# Patient Record
Sex: Female | Born: 1965 | Race: Black or African American | Hispanic: No | Marital: Single | Smoking: Current every day smoker
Health system: Southern US, Community
[De-identification: ages and names within clinical notes are randomized; demographics above are authoritative.]

## PROBLEM LIST (undated history)

## (undated) DIAGNOSIS — I10 Essential (primary) hypertension: Secondary | ICD-10-CM

## (undated) DIAGNOSIS — Z8719 Personal history of other diseases of the digestive system: Secondary | ICD-10-CM

## (undated) DIAGNOSIS — N200 Calculus of kidney: Secondary | ICD-10-CM

## (undated) HISTORY — PX: ABDOMINAL HYSTERECTOMY: SHX81

## (undated) HISTORY — PX: COLON SURGERY: SHX602

## (undated) HISTORY — PX: FRACTURE SURGERY: SHX138

---

## 2001-08-27 ENCOUNTER — Emergency Department (HOSPITAL_COMMUNITY): Admission: EM | Admit: 2001-08-27 | Discharge: 2001-08-27 | Payer: Self-pay | Admitting: *Deleted

## 2002-11-23 DIAGNOSIS — Z8719 Personal history of other diseases of the digestive system: Secondary | ICD-10-CM

## 2002-11-23 HISTORY — DX: Personal history of other diseases of the digestive system: Z87.19

## 2003-06-14 ENCOUNTER — Encounter: Payer: Self-pay | Admitting: Emergency Medicine

## 2003-06-14 ENCOUNTER — Emergency Department (HOSPITAL_COMMUNITY): Admission: EM | Admit: 2003-06-14 | Discharge: 2003-06-14 | Payer: Self-pay | Admitting: Emergency Medicine

## 2003-06-21 ENCOUNTER — Inpatient Hospital Stay (HOSPITAL_COMMUNITY): Admission: RE | Admit: 2003-06-21 | Discharge: 2003-06-23 | Payer: Self-pay | Admitting: Obstetrics & Gynecology

## 2003-10-18 ENCOUNTER — Emergency Department (HOSPITAL_COMMUNITY): Admission: EM | Admit: 2003-10-18 | Discharge: 2003-10-18 | Payer: Self-pay | Admitting: Emergency Medicine

## 2003-11-08 ENCOUNTER — Inpatient Hospital Stay (HOSPITAL_COMMUNITY): Admission: RE | Admit: 2003-11-08 | Discharge: 2003-11-16 | Payer: Self-pay | Admitting: Obstetrics & Gynecology

## 2003-11-19 ENCOUNTER — Inpatient Hospital Stay (HOSPITAL_COMMUNITY): Admission: EM | Admit: 2003-11-19 | Discharge: 2003-12-07 | Payer: Self-pay | Admitting: Emergency Medicine

## 2005-03-09 ENCOUNTER — Emergency Department (HOSPITAL_COMMUNITY): Admission: EM | Admit: 2005-03-09 | Discharge: 2005-03-09 | Payer: Self-pay | Admitting: Emergency Medicine

## 2005-11-24 ENCOUNTER — Emergency Department (HOSPITAL_COMMUNITY): Admission: EM | Admit: 2005-11-24 | Discharge: 2005-11-24 | Payer: Self-pay | Admitting: Emergency Medicine

## 2007-01-06 ENCOUNTER — Emergency Department (HOSPITAL_COMMUNITY): Admission: EM | Admit: 2007-01-06 | Discharge: 2007-01-06 | Payer: Self-pay | Admitting: Emergency Medicine

## 2007-05-23 ENCOUNTER — Emergency Department (HOSPITAL_COMMUNITY): Admission: EM | Admit: 2007-05-23 | Discharge: 2007-05-23 | Payer: Self-pay | Admitting: Emergency Medicine

## 2009-10-25 ENCOUNTER — Emergency Department (HOSPITAL_COMMUNITY): Admission: EM | Admit: 2009-10-25 | Discharge: 2009-10-25 | Payer: Self-pay | Admitting: Emergency Medicine

## 2010-09-02 ENCOUNTER — Emergency Department (HOSPITAL_COMMUNITY)
Admission: EM | Admit: 2010-09-02 | Discharge: 2010-09-02 | Payer: Self-pay | Source: Home / Self Care | Admitting: Emergency Medicine

## 2010-10-11 ENCOUNTER — Inpatient Hospital Stay (HOSPITAL_COMMUNITY): Admission: EM | Admit: 2010-10-11 | Discharge: 2010-10-13 | Payer: Self-pay | Admitting: Emergency Medicine

## 2011-02-03 LAB — URINALYSIS, ROUTINE W REFLEX MICROSCOPIC
Bilirubin Urine: NEGATIVE
Glucose, UA: NEGATIVE mg/dL
Hgb urine dipstick: NEGATIVE
Ketones, ur: NEGATIVE mg/dL
Nitrite: NEGATIVE
Protein, ur: NEGATIVE mg/dL
Specific Gravity, Urine: 1.025 (ref 1.005–1.030)
Urobilinogen, UA: 0.2 mg/dL (ref 0.0–1.0)
pH: 5.5 (ref 5.0–8.0)

## 2011-02-03 LAB — BASIC METABOLIC PANEL
BUN: 13 mg/dL (ref 6–23)
BUN: 5 mg/dL — ABNORMAL LOW (ref 6–23)
CO2: 24 mEq/L (ref 19–32)
Calcium: 8 mg/dL — ABNORMAL LOW (ref 8.4–10.5)
Chloride: 108 mEq/L (ref 96–112)
Chloride: 113 mEq/L — ABNORMAL HIGH (ref 96–112)
Creatinine, Ser: 0.42 mg/dL (ref 0.4–1.2)
Creatinine, Ser: 0.51 mg/dL (ref 0.4–1.2)
GFR calc Af Amer: >60 mL/min (ref >60)
GFR calc non Af Amer: >60 mL/min (ref >60)
Glucose, Bld: 107 mg/dL — ABNORMAL HIGH (ref 70–99)
Glucose, Bld: 97 mg/dL (ref 70–99)
Potassium: 4.2 mEq/L (ref 3.5–5.1)
Sodium: 140 mEq/L (ref 135–145)

## 2011-02-03 LAB — CBC
HCT: 34.3 % — ABNORMAL LOW (ref 36.0–46.0)
Hemoglobin: 11.1 g/dL — ABNORMAL LOW (ref 12.0–15.0)
MCH: 26.8 pg (ref 26.0–34.0)
MCHC: 31.9 g/dL (ref 30.0–36.0)
MCHC: 32.4 g/dL (ref 30.0–36.0)
MCV: 82.9 fL (ref 78.0–100.0)
MCV: 83 fL (ref 78.0–100.0)
Platelets: 272 10*3/uL (ref 150–400)
Platelets: 286 10*3/uL (ref 150–400)
RBC: 4.14 MIL/uL (ref 3.87–5.11)
RDW: 15.5 % (ref 11.5–15.5)
RDW: 15.7 % — ABNORMAL HIGH (ref 11.5–15.5)
WBC: 6.7 10*3/uL (ref 4.0–10.5)
WBC: 8.9 10*3/uL (ref 4.0–10.5)

## 2011-02-03 LAB — PROTIME-INR
INR: 1.01 (ref 0.00–1.49)
Prothrombin Time: 13.5 seconds (ref 11.6–15.2)

## 2011-02-03 LAB — APTT: aPTT: 30 seconds (ref 24–37)

## 2011-02-03 LAB — DIFFERENTIAL
Basophils Absolute: 0 10*3/uL (ref 0.0–0.1)
Basophils Relative: 0 % (ref 0–1)
Eosinophils Absolute: 0 10*3/uL (ref 0.0–0.7)
Eosinophils Relative: 0 % (ref 0–5)
Lymphocytes Relative: 27 % (ref 12–46)
Lymphs Abs: 1.8 10*3/uL (ref 0.7–4.0)
Monocytes Absolute: 0.4 10*3/uL (ref 0.1–1.0)
Monocytes Relative: 6 % (ref 3–12)
Neutro Abs: 4.5 10*3/uL (ref 1.7–7.7)
Neutrophils Relative %: 67 % (ref 43–77)

## 2011-02-03 LAB — URINE CULTURE
Colony Count: NO GROWTH
Culture  Setup Time: 201111191943
Culture: NO GROWTH

## 2011-02-04 LAB — URINALYSIS, ROUTINE W REFLEX MICROSCOPIC
Glucose, UA: NEGATIVE mg/dL
Hgb urine dipstick: NEGATIVE
Protein, ur: NEGATIVE mg/dL
Specific Gravity, Urine: 1.005 (ref 1.005–1.030)
pH: 5.5 (ref 5.0–8.0)

## 2011-02-04 LAB — URINE MICROSCOPIC-ADD ON

## 2011-04-10 NOTE — Op Note (Signed)
NAMEMATY, ZEISLER                         ACCOUNT NO.:  [id number]   MEDICAL RECORD NO.:  [id number]                   PATIENT TYPE:  INP   LOCATION:  A418                                 FACILITY:  [facility]   PHYSICIAN:  Lazaro Arms, M.D.                DATE OF BIRTH:  1966-05-02   DATE OF PROCEDURE:  11/08/2003  DATE OF DISCHARGE:                                 OPERATIVE REPORT   PREOPERATIVE DIAGNOSES:  1. Large right pelvic mass.  2. Right lower quadrant pain.   POSTOPERATIVE DIAGNOSES:  1. Large right pelvic mass.  2. Right lower quadrant pain.  3. Large right ovarian cyst.   OPERATION PERFORMED:  1. Laparotomy with right salpingo-oophorectomy.  2. Ruptured cyst intraoperatively.   SURGEON:  Lazaro Arms, M.D.   ANESTHESIA:  General endotracheal.   OPERATIVE FINDINGS:  The patient had a large ovarian cyst of the right  ovary.  It was normal when we did her surgery back in July.  It basically  filled the entire pelvis and was densely adherent to the pelvic sidewall and  small bowel and the sigmoid colon.  Tried to dissect it off the small bowel.  It was ruptured and a large amount of fluid came out and it was straw  colored.  It was not concerning for neoplasm.   DESCRIPTION OF PROCEDURE:  The patient was taken to the operating room and  placed in supine position where she underwent general endotracheal.  Foley  catheter was placed.  Her abdomen was prepped and draped in the usual  sterile fashion.  A Pfannenstiel skin incision was made and the peritoneal  cavity was entered in the usual fashion.  The mass was identified.  It was  found to be wrapped up in small bowel anteriorly, the pelvic sidewall  laterally and the sigmoid colon.  Blunt and sharp dissection was performed  to liberate it from the pelvis.  The infundibulopelvic ligament was  isolated, clamped, cut and transection suture ligated doubly with good  hemostasis.  I then ruptured the cyst trying to  dissect it off the small  bowel.  There was some serosal denuding of the small bowel and this was  oversewn without difficulty.  The small bowel was not entered during the  case.  The pelvic side wall was sort of oozy.  We held pressure and placed  some Surgicel down there.  Specimen was sent to pathology for  evaluation.  The muscle was reapproximated loosely.  The fascia closed in 0  Vicryl running, subcutaneous tissues made hemostatic and irrigated.  Skin  was closed using skin staples.  The patient tolerated the procedure well.  She experienced 100 mL of blood loss and was taken to recovery room in good  and stable condition.  All counts correct.      ___________________________________________  Lazaro Arms, M.D.   Loraine Maple  D:  11/08/2003  T:  11/09/2003  Job:  [id number]

## 2011-04-10 NOTE — Consult Note (Signed)
Ashley Rojas, RACEY                         ACCOUNT NO.:  [id number]   MEDICAL RECORD NO.:  [id number]                   PATIENT TYPE:  INP   LOCATION:  A417                                 FACILITY:  [facility]   PHYSICIAN:  Jerolyn Shin C. Katrinka Blazing, M.D.                DATE OF BIRTH:  Nov 06, 1966   DATE OF CONSULTATION:  DATE OF DISCHARGE:                                   CONSULTATION   A 45 year old female readmitted for recurrent abdominal swelling with  episodic abdominal pain, nausea, and vomiting. The patient has a history of  having total abdominal hysterectomy with left salpingo-oophorectomy on June 21, 2003.  She was discharged uneventfully and did well until she re-  presented with evidence of recurrent abdominal pain and a complex pelvic  mass.  She was explored on November 08, 2003, and was found to have a large  mass of the pelvis that was densely adherent to the small bowel and sigmoid  colon. Resection of the viscera had to be carried out in order for the mass  to be excised. The mass was benign pathologically. She had a prolonged  hospital stay in that he developed a postoperative ileus and was finally  discharged on November 16, 2003, after an eight day stay. At the time of  discharge, she was tolerating a diet, she was having small bowel movements,  and was passing flatus. She states that her abdomen, however, was still  distended and according to the acute abdominal series that was done on the  day prior to discharge she did have significant gas in her small bowel with  significant small bowel distention. Nevertheless, she tried to eat a regular  meal on Christmas Day, she had increasing abdominal distention and nausea.  On the day after Christmas she developed more pain with vomiting. She tried  some over-the-counter medications and enemas. She also tried citrate of  magnesia. All of these seemed to worsen her problem. Because of recurrent  episodes of vomiting she came to the  emergency department for re-evaluation  and was re-admitted on the evening of November 19, 2003. Since admission she  had been treated with IV fluids and Ilopan. It is noted that her potassium  has decreased to 2.8, but this has been replenished. She also has mild  elevation in her sed rate which would not be unanticipated since she is so  early postoperatively. Her abdominal series shows major dilatation of small  bowel with multiple absolute levels, with a small amount of gas that is seen  in the right colon. I cannot detect any gas on the left side or in the  rectum.   On examination at the present time, she has a tightly distended abdomen that  is very quiet except for episodic small tinkles and intermittent very  prominent episodes of borborygmus. Her abdomen is nontender and she does not  have any mass effect.  I have reviewed her x-rays from the time of her  surgery on November 08, 2003, until last evening. Discussion, clinically and  radiographically, the patient has a process that involves the distal small  bowel. It is probable that she has a high grade or near complete bowel  obstruction involving the terminal ileum since there is so much small bowel  that is massively dilated proximal to this. The presence of the borborygmus  also suggests that this is a new total obstruction rather than an ileus.  There is no significant dilatation of the right or left colon suggesting  that this is related to an ongoing adynamic intestinal motility process.   At the present time the best method of treatment would be to: 1) Continue  nasogastric suction; 2) I would withhold the Ilopan since increased  intestinal activity may worsen the proximal small bowel distention. She has  been scheduled for Gastrografin small bowel follow through. The Gastrografin  small bowel follow through will determine whether or not she has transit  through the terminal ileum and into her right colon. If she does,  then we  may be able to wait out the process. If the transit through the small bowel  is prolonged and there is minimal to very small amount of contrast reaching  the right colon, then it is probable that she has an early, but high grade  intestinal obstruction and  would warrant re-operation.  I will follow her closely with you and will be  available if it is anticipated that she would need surgical re-operation.   I wish to thank you for this consult.      ___________________________________________                                            Dirk Dress. Katrinka Blazing, M.D.   LCS/MEDQ  D:  11/20/2003  T:  11/20/2003  Job:  [id number]

## 2011-04-10 NOTE — Op Note (Signed)
Ashley Rojas, Ashley Rojas                         ACCOUNT NO.:  [id number]   MEDICAL RECORD NO.:  [id number]                   PATIENT TYPE:  INP   LOCATION:  A224                                 FACILITY:  [facility]   PHYSICIAN:  Jerolyn Shin C. Katrinka Blazing, M.D.                DATE OF BIRTH:  Mar 10, 1966   DATE OF PROCEDURE:  DATE OF DISCHARGE:                                 OPERATIVE REPORT   PREOPERATIVE DIAGNOSIS:  Small-bowel obstruction.   POSTOPERATIVE DIAGNOSIS:  Extensive pelvic adhesions with small bowel  inspection.   PROCEDURE:  Extensive adhesiolysis with distal small bowel and cecum  resection and central line placement.   INDICATIONS:  A 45 year old female admitted 11/19/03 after a prolonged  postoperative period, status post salpingo-oophorectomy by Dr. Despina Hidden.  The  patient has been evaluated and is found to have distal small-bowel  obstruction.  She is having increasing pain, nausea, and vomiting.  She had  a Gastrografin study which showed transudative contrast into the small bowel  but it trickled way down.  She is, therefore, being explored because of  increasing pain, abdominal distention, nausea, and vomiting.   SURGEON:  Dirk Dress. Katrinka Blazing, M.D. and Lazaro Arms, M.D.   DESCRIPTION:  Under general anesthesia, the patient's abdomen was prepped  and draped in a sterile field.  The old Pfannenstiel was entered and was  dissected in usual fashion.  Upon entering the peritoneal cavity, there was  some clear fluid that was encountered.  No purulence was encountered.  There  were extensive adhesions of the small bowel and rectum, in the deep pelvis.  It took quite a lot of gentle dissection but the terminal bowel was so  tightly adherent that the abdominal incision had to be opened by extending a  midline incision. This had been discussed with the patient preoperatively.  Once this was done, the bowel was eviscerated and with careful dissection,  all of the adhesed terminal small bowel  and small bowel was dissected free.  Once this was done, it was quite apparent that the small bowel was not going  to be adequate for reanastomosis or to leave behind because of the high  potential for recurrent obstruction.  It was elected, therefore, to do  terminal ileal resection along with the cecum.  About 4-6 cm of cecum and  about 12-14 cm of terminal ileum was resected.  Vessels were doubly clamped,  divided, and tied with ligatures of 2-0 silk.  The ileum and ascending colon  were divided using a TIA 70 stapler.  Once this was done, the proximal small  bowel was decompressed up to the ligament of Treitz using an 18-gauge  catheter through the end of the small bowel.  The anastomosis was then  performed free hand using 3-0 Prolene and 2-0 Biosyn.  The mesenteric defect  was closed with 3-0 Prolene.  The anastomosis appeared to be widely patent.  Sponge, needle, instrument, and blade counts were verified as correct.  The  pelvis was inspected, and further irrigation was carried out.  Since the  hematoma was adequately evacuated, there was felt not to be a need to place  a drain.  The abdomen was then closed.  The upper fascia was closed with 0  Prolene.  The muscle was reapproximated with 2-0 Biosyn.  The transverse  fascial incision was closed with 0 Prolene, and the subcutaneous tissue was  closed with 3-0 Biosyn.  The skin was closed with staples.  A dressing was  placed, and attention was turned to the left subclavian area.  The area was  prepped and draped.  The patient was placed in Trendelenburg position.  Multiple sticks were made but the left subclavian vein could not be  accessed.  Attention was then turned to the right subclavian area.  It was  prepped and draped in a similar manner.  The vein was accessed with a single  stick, and the guide wire was placed.  The tract for the catheter was  dilated using the dilator.  The catheter was then positioned over the guide  wire  without difficulty.  It was flushed with heparin flush.  It was sutured  into place, and sterile dressing was placed.  After that, the patient was  awakened from anesthesia.  She was transferred to her bed.  Chest x-ray was  done in the ER.  The patient was transferred to the ICU in stable condition.      ___________________________________________                                            Dirk Dress Katrinka Blazing, M.D.   LCS/MEDQ  D:  11/25/2003  T:  11/25/2003  Job:  [id number]

## 2011-04-10 NOTE — Discharge Summary (Signed)
   NAMEHENRI, Ashley Rojas                         ACCOUNT NO.:  [id number]   MEDICAL RECORD NO.:  [id number]                   PATIENT TYPE:  INP   LOCATION:  A301                                 FACILITY:  [facility]   PHYSICIAN:  Lazaro Arms, M.D.                DATE OF BIRTH:  03-14-1966   DATE OF ADMISSION:  06/21/2003  DATE OF DISCHARGE:  06/23/2003                                 DISCHARGE SUMMARY   DISCHARGE DIAGNOSES:  1. Status post abdominal hysterectomy and left salpingo-oophorectomy.  2. Fibroid uterus with dysmenorrhea, hypermenorrhea, and dyspareunia.  3. 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   PROCEDURES:  As above.   HISTORY OF PRESENT ILLNESS:  Please refer to the transfer history and  physical and the operative report for details of admission to the hospital.   HOSPITAL COURSE:  The patient was admitted postoperatively.  Her  intraoperative course was unremarkable.  She tolerated clear liquids and  progression to a regular diet.  She voided without symptoms, was ambulatory.  Her incision remained clean, dry, and intact, and her abdomen benign.  Her  hemoglobin and hematocrit on postoperative day #1 were 11.1 and 33.5, as  compared to 12.8 and 38.9 preoperatively.  She was discharged to home on the  morning of postoperative day #2, in good stable condition.  Follow up in the  office on Wednesday, June 27, 2003, to have her staples removed.  Her  incision again was clean, dry, and intact.  She was discharged on Tylox and  Motrin for pain, and given instructions to use Dulcolax suppositories as  needed.  She was given instructions and precautions for contact in the  office prior to that time.                                               Lazaro Arms, M.D.    Loraine Maple  D:  06/23/2003  T:  06/23/2003  Job:  [id number]

## 2011-04-10 NOTE — Op Note (Signed)
Ashley Rojas, Ashley Rojas                         ACCOUNT NO.:  [id number]   MEDICAL RECORD NO.:  [id number]                   PATIENT TYPE:  INP   LOCATION:  A224                                 FACILITY:  [facility]   PHYSICIAN:  Jerolyn Shin C. Katrinka Blazing, M.D.                DATE OF BIRTH:  Aug 26, 1966   DATE OF PROCEDURE:  11/25/2003  DATE OF DISCHARGE:                                 OPERATIVE REPORT   PREOPERATIVE DIAGNOSIS:  Small-bowel obstruction, __________  , extensive  pelvic adhesions.   POSTOPERATIVE DIAGNOSIS:  __________  .   PROCEDURE:  __________  .   SURGEONS:  Dirk Dress. Katrinka Blazing, M.D. and Lazaro Arms, M.D.   INDICATIONS:  __________  discharge.  She had recurrent nausea and vomiting.  She was treated with nasogastric decompression without improvement.  Small-  bowel follow-through was done, and this did not show a point of obstruction,  and she started having bowel movements.  Over the past 18 hours, she has had  increasing severe abdominal pain and increasing abdominal distention with  recurrent episodes of nausea and vomiting.  It is felt that she needs  operative treatment __________  .   DESCRIPTION OF PROCEDURE:  Under general anesthesia, the abdomen was prepped  and draped in a sterile field.  The old Pfannenstiel incision was entered.  Fascia was separated from the muscle.  Muscle was incised in the midline.  There was a small amount of serous fluid.  There were __________  .  After  much inspection, __________  opening in the midline.  __________  .  This  was successfully dissected free.  The small bowel appeared to be markedly  involved __________  .  I, therefore elected to dissect about 12 cm of small  bowel along with cecum.  This was done with the GIA 75 stapler.  The vessels  of the mesentery were doubly clamped divided, and tied with two ligatures of  2-0 silk.  Once the bowel was excised, the proximal small bowel was  decompressed using an 18-gauge nasogastric tube  passed through the end of  the residual ileum.  The bowel was decompressed up to the ligament of  Treitz.  Next, the ascending colon was further mobilized.  It was elected to  do a hand-sewn anastomosis to try to prevent the possibility of leak.  The  end of the ileum was sewn to the side of the ascending colon.  This was done  using a 2-0 Biosyn and 3-0 __________  .  Gloves were changed.  The  __________  .  The abdomen was closed.  __________  .  The fascia was closed  transversely __________  .  The subcutaneous tissue was closed with 2-0  Biosyn.  __________  .      ___________________________________________  Dirk Dress. Katrinka Blazing, M.D.   LCS/MEDQ  D:  11/25/2003  T:  11/25/2003  Job:  [id number]

## 2011-04-10 NOTE — Op Note (Signed)
NAMEOVIDA, DELAGARZA                         ACCOUNT NO.:  [id number]   MEDICAL RECORD NO.:  [id number]                   PATIENT TYPE:  INP   LOCATION:  A224                                 FACILITY:  [facility]   PHYSICIAN:  Jerolyn Shin C. Katrinka Blazing, M.D.                DATE OF BIRTH:  [birth year]   DATE OF PROCEDURE:  11/25/2003  DATE OF DISCHARGE:                                 OPERATIVE REPORT   PREOPERATIVE DIAGNOSIS:  Small-bowel obstruction.   POSTOPERATIVE DIAGNOSIS:  __________  .    Dictation ends at this point.      ___________________________________________                                            Dirk Dress. Katrinka Blazing, M.D.   LCS/MEDQ  D:  11/25/2003  T:  11/25/2003  Job:  [id number]

## 2011-04-10 NOTE — H&P (Signed)
Ashley Rojas, Ashley Rojas                         ACCOUNT NO.:  [id number]   MEDICAL RECORD NO.:  [id number]                   PATIENT TYPE:  INP   LOCATION:  A417                                 FACILITY:  [facility]   PHYSICIAN:  Tilda Burrow, M.D.              DATE OF BIRTH:  [birth year]   DATE OF ADMISSION:  11/19/2003  DATE OF DISCHARGE:                                HISTORY & PHYSICAL   ADMITTING DIAGNOSIS:  Recurrent postoperative ileus versus small bowel  obstruction.   HISTORY OF PRESENT ILLNESS:  This 45 year old female status post abdominal  hysterectomy June 20, 2003 and then subsequently status post right salpingo-  oophorectomy for right lower quadrant mass and right lower quadrant pain on  November 08, 2003 is readmitted 10 days status post salpingo-oophorectomy.  Recent hospitalization was complicated for postoperative partial small bowel  obstruction and postoperative anemia greater than anticipated for 100 mL  operative blood loss. She was admitted on the evening of November 09, 2003  after developing a recurrence of vomiting on the evening of December 27  after having been at home and having small amounts of gas and stool passage  at home during the time at home. She denies fever. She was seen in the  emergency room with marked abdominal distention, watery bowel sounds of low  frequency without tympany, with guarding but no rebound tenderness.  She was  admitted for repeat nasogastric decompression, radiographic studies or  reoperation if necessary. The patient was admitted last night and at the  time of this dictation 8 p.m., November 20, 2003 she has had results of  three way abdomen from last night showing unimproved status of her abdominal  distention since December 23.  She has been seen in consultation by Dr.  Katrinka Blazing and plan of management is as follows; a Gastrografin small bowel  series has been ordered to check transit time and see if there is evidence  of  suspected high grade obstruction.  Plans are to decide based on  radiographic studies whether reexploration may be necessary.  The patient's  been informed that Gastrografin study will be possibly diagnostic as well as  possibly therapeutic and decisions regarding possible reoperation will be  made based on these results. Additionally, electrolytes have been identified  as showing marked hypokalemia with K of 2.7 last night, and this a.m. 2.8  with 40 mEq of intravenous potassium administered today.   PAST SURGICAL HISTORY:  Tubal ligation in the distant past, hysterectomy and  left salpingo-oophorectomy for fibroids and dermoids summer July 2004, right  salpingo-oophorectomy November 08, 2003.   PHYSICAL EXAMINATION:  GENERAL:  Shows a slim female, weight 131.9 down  approximately 3 pounds from recent admission.   LABORATORY DATA:  Hemoglobin of 11.2, hematocrit 34.3 up significantly from  September 2 and 28% on December 23.  White count within normal limits but  increased at 9900.  Differential is not  showing significant left shift.   IMPRESSION:  Recurrent postop ileus versus bowel obstruction.   PLAN:  1. High grade nasogastric tube to be continued to decompress the abdomen     with Gastrografin study tomorrow.  2. Assess transit time and to make decisions regarding surgical     reexploration which may be necessary Thursday.     ___________________________________________                                         Tilda Burrow, M.D.   JVF/MEDQ  D:  11/20/2003  T:  11/20/2003  Job:  [id number]

## 2011-04-10 NOTE — Discharge Summary (Signed)
NAMEBENETTA, Ashley Rojas                         ACCOUNT NO.:  [id number]   MEDICAL RECORD NO.:  [id number]                   PATIENT TYPE:  INP   LOCATION:  A335                                 FACILITY:  [facility]   PHYSICIAN:  Jerolyn Shin C. Katrinka Blazing, M.D.                DATE OF BIRTH:  February 14, 1966   DATE OF ADMISSION:  11/19/2003  DATE OF DISCHARGE:  12/07/2003                                 DISCHARGE SUMMARY   DISCHARGE DIAGNOSES:  1. Small bowel obstruction due to extensive pelvic small bowel adhesions.  2. Anemia.  3. Protein calorie malnutrition.   SPECIAL PROCEDURE:  Extensive adhesiolysis with small bowel and cecal  resection and central line placement, November 25, 2003.   DISPOSITION:  The patient is discharged home in stable, satisfactory  condition.   DISCHARGE MEDICATIONS:  1. Doxycycline 100 mg b.i.d.  2. Reglan 10 mg a.c. and h.s.  3. Tylox two q.4h. p.r.n.  4. Phenergan 25 mg q.4h. p.r.n. nausea.  5. Milk of magnesia two tablespoons b.i.d. p.r.n.   The patient is scheduled to be seen in the office in one week post  discharge.   SUMMARY:  The patient is  45 year old female readmitted for recurrent  abdominal swelling, episodic abdominal pain, nausea, and vomiting.  She has  a history of total abdominal hysterectomy with left salpingo-oophorectomy on  June 21, 2003.  She was discharged uneventful and did well until she re-  presented with evidence of recurrent abdominal pain and pelvic mas. She was  explored on November 08, 2003, and was found to have a mass in the pelvis  that was densely adherent to the small bowel in the sigmoid colon. Resection  of the viscera was carried out. The mass was pathologically benign. She had  a prolonged hospital stay due to a postoperative ileus.  She was discharged  on the 8th postoperative day. At the time of discharge she was tolerating a  diet and having bowel movements and flatus. She states that her abdomen,  however, was distended.   The patient ate a regular meal on the 25th and had  increasing abdominal pain with nausea. She later developed vomiting.  Because of the recurrent episodes of vomiting she was seen in the emergency  room and re-admitted on November 18, 2004. The patient was treated with IV  fluids and _________. She had hypokalemia which was replenished. Abdominal  series showed major dilatation of the small bowel with multiple air-fluid  levels.  The patient was admitted by Dr. Emelda Fear and Dr. Duane Lope. She  was seen in consultation and a Gastrografin and small bowel study was done.  She developed progressive worsening of her abdominal distention. By the 30th  she was having multiple bowel movements and seemed to be improved. It was  thought that she would benefit from nasogastric decompression, but she  refused to have this placed.  Over the next few days,  the patient seemed to  be improving. However, by the second she was having severe abdominal pain  and major distention with nausea and vomiting. It was decided to explore.  She was explored on the afternoon of November 25, 2003. She was found to have  extensive adhesions of the pelvis with involvement of the cecum and terminal  ileum. This was small bowel resection of the cecum;  the resection was  carried out uneventfully. She was started on TPN in the postoperative  period. She had a somewhat protracted postoperative course, but had gradual  improvement. Because of anemia, she was transfused. She tolerated her  transfusions well.  She was able to tolerate liquids by the fifth  postoperative day. She developed a superficial drainage in the mid portion  of her incision, but this did not appear to be purulent.  Diet was advanced  and she tolerated this well. She had multiple bowel movements.  She was  finally discharged on the 12th postoperative day much improved, in  satisfactory condition, without nausea, vomiting, and without fever.  Tolerating a  regular diet.     ___________________________________________                                         Dirk Dress. Katrinka Blazing, M.D.   LCS/MEDQ  D:  01/06/2004  T:  01/06/2004  Job:  [id number]   cc:   Lazaro Arms, M.D.  [address], [address]  [place]  [place] [address]  Fax: [phone]

## 2011-04-10 NOTE — Op Note (Signed)
Ashley Rojas, Ashley Rojas                         ACCOUNT NO.:  [id number]   MEDICAL RECORD NO.:  [id number]                   PATIENT TYPE:  AMB   LOCATION:  DAY                                  FACILITY:  [facility]   PHYSICIAN:  Lazaro Arms, M.D.                DATE OF BIRTH:  1966-09-01   DATE OF PROCEDURE:  06/21/2003  DATE OF DISCHARGE:                                 OPERATIVE REPORT   PREOPERATIVE DIAGNOSES:  1. Enlarged fibroid uterus.  2. Hypermenorrhea.  3. Dysmenorrhea.  4. Left dermoid cyst.   POSTOPERATIVE DIAGNOSES:  1. Enlarged fibroid uterus.  2. Hypermenorrhea.  3. Dysmenorrhea.  4. Left dermoid cyst.   PROCEDURE:  Abdominal hysterectomy with left salpingo-oophorectomy.   SURGEON:  Lazaro Arms, M.D.   ANESTHESIA:  General endotracheal.   FINDINGS:  The patient had an enlarged fibroid uterus, approximately 12-week  size, and had multiple myomas.  She also had a complex left ovarian cyst  consistent with a dermoid.  Her right ovary and tube were normal.  There  were no other abnormalities of the intraperitoneal cavity identified.   DESCRIPTION OF PROCEDURE:  The patient was taken to the operating room and  placed in the supine position where she underwent general endotracheal  anesthesia.  She was then frog-legged.  Her vagina was prepped in the usual  sterile fashion, and Foley catheter was placed in the bladder.  The abdomen  was then prepped and draped in the usual fashion.   A Pfannenstiel skin incision was made and carried down sharply to the rectus  fascia which was scored in the midline and extended laterally.  The fascia  was taken off of the muscle superiorly and inferiorly without difficulty.  The muscles were divided.  The peritoneal cavity was entered.  A large  protractor plastic self-retaining retractor was placed.  The upper abdomen  was packed away.  The uterine cornu were grasped.  The left round ligament  was identified, suture ligated,  and cut.  The peritoneum was then dissected,  and the left vesicouterine serosal flap was created.  The infundibulopelvic  ligament on the left was then clamped, cut, and double suture ligated, with  good hemostasis.  The uterine vessels on the left were skeletonized.  The  right round ligament was suture ligated and cut, and the vesicouterine  serosal flap on the right was created.  The uretero-ovarian ligament on the  right was clamped, cut, and double suture ligated, thus sparing the right  ovary and tube.  The bladder flap was pushed down off the lower uterine  segment, and the uterine vessels on the right were skeletonized.  The left  and right uterine vessels were clamped, cut, and suture ligated.  Serial  pedicles were taken down the cervix to the cardinal ligament, each pedicle  being clamped, cut, and transfixion suture ligated.  The vagina was  crossclamped.  The specimen was removed.  The vaginal angle sutures were  placed, and the vagina was closed with interrupted figure-of-eight sutures.  There was good hemostasis.  The peritoneal cavity was irrigated vigorously.  All pedicles were found to be hemostatic.  Intercede was placed in the  vaginal cuff and bladder dissection.  All packs were removed.  The  protractor self-retaining retractor was removed.  All counts were correct at  this point.  The muscles were reapproximated loosely.  The fascia was closed  with 0 Vicryl running.  The subcutaneous tissues were made hemostatic and  irrigated.  The skin was closed using skin staples.  Marcaine 0.5% with  1:200,000 epinephrine was injected into the subcutaneous tissue for  postoperative anesthesia, 20 cc total.  A pressure dressing was placed.   The patient was awakened from anesthesia and taken to the recovery room in  good and stable condition.  All counts were correct x3.  All specimens were  sent to the lab.  She received Ancef prophylactically preoperatively.                                                Lazaro Arms, M.D.    Loraine Maple  D:  06/21/2003  T:  06/21/2003  Job:  [id number]

## 2011-04-10 NOTE — Discharge Summary (Signed)
NAMEBRENTLEE, Ashley Rojas                         ACCOUNT NO.:  [id number]   MEDICAL RECORD NO.:  [id number]                   PATIENT TYPE:  INP   LOCATION:  A418                                 FACILITY:  [facility]   PHYSICIAN:  Lazaro Arms, M.D.                DATE OF BIRTH:  [birth year]   DATE OF ADMISSION:  11/08/2003  DATE OF DISCHARGE:  11/16/2003                                 DISCHARGE SUMMARY   DISCHARGE DIAGNOSES:  1. Status post an exploratory laparotomy with right salpingo-oophorectomy     and extensive lysis of adhesions.  2. Postoperative ileus, improving.   PROCEDURES:  Exploratory laparotomy with right salpingo-oophorectomy and  lysis of adhesions.   Please refer to the transcribed history and physical and the operative  report for details of admission to the hospital.   HOSPITAL COURSE:  The patient was admitted postoperatively to the 4th floor.  She had a slightly increased drop in hemoglobin and hematocrit than was  expected.  There was minimal intraoperative blood loss.  Her hemoglobin  preoperatively was 12.7 and hematocrit 39.2.  She dwindled down to 8.6 and  26.2.  She had a normal white count.  This took several days.  The patient  developed on postoperative day #2 an ileus with bilious nausea and vomiting,  abdominal distention.  She had an NG tube placed, underwent NG suction for  72 hours.  Her abdominal films improved dramatically.  Her NG tube was  clamped.  She tolerated clear liquids, and the NG tube was removed.  Subsequent to that she received daily flush enemas to stimulate lower  colonic action.  When she had her surgery in July she had not quite as  severe but a similar slow return of bowel function.  By the day of discharge  she was tolerated full liquids.  It was Christmas Eve.  Her abdominal  distention was significantly improved.  She had good bowel sounds.  Her  incision was clean, dry, and intact.  Staples had been removed.  As a  result, at  her request she was discharged to home to follow up in the office  on Monday, which would have been 2 days' postdischarge, for evaluation in  the office.  She was given Tylox and Motrin for pain and given instruction  precautions for return prior to that time.  She was instructed to have a  bland diet at home.     ___________________________________________                                         Lazaro Arms, M.D.   LHE/MEDQ  D:  12/03/2003  T:  12/03/2003  Job:  [id number]

## 2011-04-10 NOTE — H&P (Signed)
[name], [name]                         ACCOUNT NO.:  [id number]   MEDICAL RECORD NO.:  [id number]                   PATIENT TYPE:  AMB   LOCATION:  DAY                                  FACILITY:  [facility]   PHYSICIAN:  Lazaro Arms, M.D.                DATE OF BIRTH:  [birth year]   DATE OF ADMISSION:  06/20/2003  DATE OF DISCHARGE:                                HISTORY & PHYSICAL   HISTORY OF PRESENT ILLNESS:  Ashley Rojas is a 45 year old gravida 3, para 3,  status post tubal ligation many years ago, who was seen in the ER last week  with sharp low abdominal pain and suprapubic pressure, which had been going  on for a couple of days. She was found to have a pelvic mass and underwent  an ultrasound, which showed an enlarged fibroid uterus and an ovarian mass,  which appeared to be complex and was consistent with a dermoid tumor. On  examination, it is not felt that she was having ovarian torsion but possibly  having some fibroid degeneration. She was quite tender on examination. We  talked about conservative management versus definitive therapy and we  decided to proceed with TAH/LSO at patient's request and agreement. The rest  of her workup for GC and Chlamydia, and syphilis were negative. Her  hemoglobin and hematocrit was 12 and 37 and her white blood cell count was  normal.   PAST MEDICAL HISTORY:  Negative.   PAST SURGICAL HISTORY:  Tubal ligation.   PAST OBSTETRIC HISTORY:  Three vaginal deliveries.   REVIEW OF SYSTEMS:  Otherwise negative except for heavy menstrual period  with heavy bleeding and clots and real crampy.   PHYSICAL EXAMINATION:  VITAL SIGNS: Weight 136 pounds. Blood pressure  120/80.  HEENT: Unremarkable.  NECK: Thyroid is normal.  LUNGS: Clear.  HEART: Regular rate and rhythm. Without murmur, rub, or gallop.  BREAST: Without mass, discharge, or skin changes.  ABDOMEN: Benign. No hepatosplenomegaly or masses except in the pelvis where  she is very  tender.  PELVIC: She has normal external genitalia. Vagina __________ without  discharge. The uterus is enlarged approximately 12 week size and tender. The  left adnexa is also tender and enlarged. The right adnexa is negative.  EXTREMITIES: Warm with no edema.  NEUROLOGIC: Grossly intact.   IMPRESSION:  1. Enlarged fibroid uterus.  2. Dysmenorrhea and hypermenorrhea.  3. Enlarged left ovarian mass consistent with a dermoid.    PLAN:  The patient admitted for a total abdominal hysterectomy and left  salpingo-oophorectomy. We are not planning to remove the right ovary at this  time if it is normal. She understands the risks of the procedure including  bleeding, infection and damage to other organs especially the bladder, and  we will proceed. She was given appropriate preoperative handouts on  hysterectomy and fibroids.  Lazaro Arms, M.D.    Ashley Rojas  D:  06/20/2003  T:  06/20/2003  Job:  [id number]

## 2011-04-10 NOTE — Consult Note (Signed)
   NAMERHETA, HEMMELGARN NO.:  [id number]   MEDICAL RECORD NO.:  [id number]                   PATIENT TYPE:  EMS   LOCATION:  ED                                   FACILITY:  [facility]   PHYSICIAN:  Lazaro Arms, M.D.                DATE OF BIRTH:  1966/06/12   DATE OF CONSULTATION:  06/14/2003  DATE OF DISCHARGE:                                   CONSULTATION   HISTORY OF PRESENT ILLNESS:  I was asked to [facility].  Dr. Dorann Lodge consulted me.  She is a 45 year old African American female, gravida 3,  para 3, status post tubal ligation many years ago, who presented with sharp  lower abdominal pain, suprapubic pressure since yesterday.   On examination, she was felt to have a pelvic mass, underwent a sonogram and  she has an approximately 5 cm left ovarian mass, consistent with a dermoid  tumor.  She also has a 4 cm posterior myoma.   On examination, the __________ was exquisitely tender as is the ovarian  mass.  I cannot tell really which is causing the pain.   IMPRESSION:  My impression is, this represents ovarian torsion, subacute and  possibly fibroid degeneration.  Upon further questioning, the patient does  indeed have a history of very heavy periods, with lots of clotting and  cramping.  So it could be, myoma degeneration.  In any event, with the  amount of pain she is in, I have given her Lorcet Plus to take over the  weekend and will plan to have her come to the office on Monday and will plan  surgery for next week, TAH/LSO.                                                Lazaro Arms, M.D.    Loraine Maple  D:  06/14/2003  T:  06/14/2003  Job:  [id number]

## 2011-04-10 NOTE — H&P (Signed)
[name], [name] NO.:  [id number]   MEDICAL RECORD NO.:  [id number]                  PATIENT TYPE:   LOCATION:                                       FACILITY:   PHYSICIAN:  Lazaro Arms, M.D.                DATE OF BIRTH:   DATE OF ADMISSION:  11/08/2003  DATE OF DISCHARGE:                                HISTORY & PHYSICAL   HISTORY OF PRESENT ILLNESS:  Ashley Rojas is a 45 year old African American  female, gravida 3, para 3, status post a total abdominal hysterectomy, left  salpingo-oophorectomy in June 21, 2003 due to enlarged fibroid uterus and a  left dermoid cyst.  I have seen her in the emergency room acutely for pain  the week prior, and she had an unremarkable postoperative course.  She had  been doing well until about three weeks ago.  She began having right lower  quadrant pain.  She was seen in the emergency room and was instructed to  follow up with [place].  She started having right lower quadrant pain with  intercourse, but the pain has now become more constant and at times, very  sharp and comes on suddenly.  In examination in the office, I felt a pelvic  mass at the top of the cuff and did a vaginal probe ultrasound which  revealed a 7.4 x 6.6 multicystic mass within septations.  No evidence of any  free fluid.  Most consistent with peritoneal occlusion cyst and or a  multiseptated ovarian cyst.  As a result, she is admitted for a laparotomy  with right salpingo-oophorectomy and excision of pelvic mass.   PAST MEDICAL HISTORY:  Negative.   PAST SURGICAL HISTORY:  1. Tubal ligation.  2. Total abdominal hysterectomy, left salpingo-oophorectomy.   PAST OB HISTORY:  Three vaginal deliveries.   SOCIAL HISTORY:  The patient does smoke.   ALLERGIES:  None.   MEDICATIONS:  None.   REVIEW OF SYSTEMS:  As per History of Present Illness.   PHYSICAL EXAMINATION:  GENERAL:  Weight 135 pounds.  VITAL SIGNS:  Blood pressure 100/80.  HEENT:  Unremarkable.  NECK:  Thyroid is normal.  LUNGS:  Clear.  HEART:  Regular rate and rhythm, without murmur, rubs or gallops.  BREASTS:  Deferred.  ABDOMEN:  Tender in the right lower quadrant.  Well-healed Pfannenstiel skin  incision.  PELVIC:  Cuff is intact.  On bimanual exam, she has a obvious mass at the  cuff and to the right.  Tender to palpation.  The left adnexa is negative.  RECTAL:  Negative.  EXTREMITIES:  Warm with no edema.   IMPRESSIONS:  1. Multiseptated right pelvic mass.  2. Status post total abdominal hysterectomy, left salpingo-oophorectomy,     June 21, 2003.   PLAN:  The patient is admitted for laparotomy with right salpingo-  oophorectomy and excision of pelvic mass, lysis of adhesions as indicated.  The patient understands the risks, benefits, indications and will proceed.     ___________________________________________                                         Lazaro Arms, M.D.   Ashley Rojas  D:  11/07/2003  T:  11/08/2003  Job:  [id number]

## 2013-05-30 ENCOUNTER — Emergency Department (HOSPITAL_COMMUNITY)
Admission: EM | Admit: 2013-05-30 | Discharge: 2013-05-30 | Disposition: A | Discharge status: Home / Self Care | Payer: Self-pay | Attending: Emergency Medicine | Admitting: Emergency Medicine

## 2013-05-30 ENCOUNTER — Emergency Department (HOSPITAL_COMMUNITY): Payer: Self-pay

## 2013-05-30 ENCOUNTER — Encounter (HOSPITAL_COMMUNITY): Payer: Self-pay | Admitting: *Deleted

## 2013-05-30 DIAGNOSIS — IMO0002 Reserved for concepts with insufficient information to code with codable children: Secondary | ICD-10-CM

## 2013-05-30 DIAGNOSIS — I6521 Occlusion and stenosis of right carotid artery: Secondary | ICD-10-CM

## 2013-05-30 DIAGNOSIS — R51 Headache: Secondary | ICD-10-CM | POA: Insufficient documentation

## 2013-05-30 DIAGNOSIS — I6529 Occlusion and stenosis of unspecified carotid artery: Secondary | ICD-10-CM | POA: Insufficient documentation

## 2013-05-30 DIAGNOSIS — F17200 Nicotine dependence, unspecified, uncomplicated: Secondary | ICD-10-CM | POA: Insufficient documentation

## 2013-05-30 DIAGNOSIS — M5412 Radiculopathy, cervical region: Secondary | ICD-10-CM

## 2013-05-30 DIAGNOSIS — M5030 Other cervical disc degeneration, unspecified cervical region: Secondary | ICD-10-CM | POA: Insufficient documentation

## 2013-05-30 MED ORDER — PREDNISONE 50 MG PO TABS
60.0000 mg | ORAL_TABLET | Freq: Once | ORAL | Status: AC
  Administered 2013-05-30: 60 mg via ORAL
  Filled 2013-05-30: qty 1

## 2013-05-30 MED ORDER — HYDROCODONE-ACETAMINOPHEN 5-325 MG PO TABS: 1.0000 | ORAL_TABLET | ORAL | Status: DC | PRN

## 2013-05-30 MED ORDER — HYDROCODONE-ACETAMINOPHEN 5-325 MG PO TABS
1.0000 | ORAL_TABLET | Freq: Once | ORAL | Status: AC
  Administered 2013-05-30: 1 via ORAL
  Filled 2013-05-30: qty 1

## 2013-05-30 MED ORDER — PREDNISONE 10 MG PO TABS: ORAL_TABLET | ORAL | Status: DC

## 2013-05-30 NOTE — ED Notes (Signed)
C/o intermittent headache , concerned she may have an aneurysm

## 2013-05-30 NOTE — ED Notes (Signed)
Pain and soreness left upper extremity

## 2013-05-30 NOTE — ED Notes (Signed)
Patient having recurrance of L shoulder and arm pain and intermittent HA x 2 weeks.  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

## 2013-05-31 NOTE — ED Provider Notes (Signed)
History    CSN: [id number] Arrival date & time 05/30/13  1108  First MD Initiated Contact with Patient 05/30/13 1133     Chief Complaint  Patient presents with  . Shoulder Pain  . Arm Pain   (Consider location/radiation/quality/duration/timing/severity/associated sxs/prior Treatment) HPI Comments: Ashley Rojas is a 47 y.o. Female presenting with an approximate 2 week history of constant left neck and shoulder pain which radiates into her left arm and is worse when she raises her left arm and with palpation across her upper shoulder and back area.  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   She denies weakness or numbness in her arm or hand.  She also describes intermittent episodes of superior head and scalp burning pain which she states occurs without provocation,  Radiates down her entire scalp, face, neck ending in her upper chest and lasting for 8-10 seconds, then completely resolves.  She has this sensation at least once daily for the past few weeks as well.  She does have occasional hot flashes but this new symptom is different from her typical hot flash.  She denies chest pain, shortness of breath, focal weakness, numbness, dysarthria, visual changes, rash, dizziness or headache between these fleeting episodes.  She has tried tylenol without relief of symptoms.     The history is provided by the patient.   History reviewed. No pertinent past medical history. Past Surgical History  Procedure Laterality Date  . Abdominal hysterectomy    . Fracture surgery      R femur   History reviewed. No pertinent family history. History  Substance Use Topics  . Smoking status: Current Every Day Smoker -- 0.50 packs/day    Types: Cigarettes  . Smokeless tobacco: Not on file  . Alcohol Use: Yes     Comment: occasional   OB History   Grav Para Term Preterm Abortions TAB SAB  Ect Mult Living                 Review of Systems  Constitutional: Negative for fever and chills.  HENT: Negative for hearing loss, congestion, sore throat, rhinorrhea, neck pain, neck stiffness and sinus pressure.   Eyes: Negative.  Negative for visual disturbance.  Respiratory: Negative for chest tightness and shortness of breath.   Cardiovascular: Negative for chest pain.  Gastrointestinal: Negative for nausea and abdominal pain.  Genitourinary: Negative.   Musculoskeletal: Positive for arthralgias. Negative for myalgias and joint swelling.  Skin: Negative.  Negative for color change, rash and wound.  Neurological: Positive for headaches. Negative for dizziness, weakness, light-headedness and numbness.  Psychiatric/Behavioral: Negative.     Allergies  Review of patient's allergies indicates no known allergies.  Home Medications   Current Outpatient Rx  Name  Route  Sig  Dispense  Refill  . acetaminophen (TYLENOL) 500 MG tablet   Oral   Take 1,000 mg by mouth every 6 (six) hours as needed for pain.         Marland Kitchen HYDROcodone-acetaminophen (NORCO/VICODIN) 5-325 MG per tablet   Oral   Take 1 tablet by mouth every 4 (four) hours as needed for pain.   20 tablet   0   . predniSONE (DELTASONE) 10 MG tablet      6, 5, 4, 3, 2 then 1 tablet by mouth daily for 6 days total.   21 tablet   0  BP 103/62  Pulse 60  Temp(Src) 97.9 F (36.6 C) (Oral)  Ht 5\' 7"  (1.702 m)  Wt 130 lb (58.968 kg)  BMI 20.36 kg/m2  SpO2 100% Physical Exam  Constitutional: She appears well-developed and well-nourished.  HENT:  Head: Atraumatic.  Right Ear: Tympanic membrane and ear canal normal.  Left Ear: Tympanic membrane and ear canal normal.  Nose: No mucosal edema.  Mouth/Throat: Uvula is midline and oropharynx is clear and moist.  Eyes: Conjunctivae and EOM are normal. Pupils are equal, round, and reactive to light.  Neck: Normal range of motion.  Cardiovascular: Normal rate and regular  rhythm.   No murmur heard. Radial pulses equal bilaterally.  No carotid bruit.  bp 103/60 - typo in original nursing notes.  Musculoskeletal: She exhibits tenderness.       Left shoulder: She exhibits tenderness. She exhibits normal range of motion, no bony tenderness, no swelling, no deformity, no spasm, normal pulse and normal strength.  ttp across left upper shoulder and trapezius musculature radiating to left upper scapular border.  No midline c spine pain. No sx with axial loading.  Neurological: She is alert. She has normal strength. She displays normal reflexes. No cranial nerve deficit or sensory deficit.  Reflex Scores:      Bicep reflexes are 2+ on the right side and 2+ on the left side. Equal grip strength  Skin: Skin is warm and dry.  Psychiatric: She has a normal mood and affect.    ED Course  Procedures (including critical care time) Labs Reviewed - No data to display Dg Cervical Spine Complete  05/30/2013   *RADIOLOGY REPORT*  Clinical Data: Neck pain.  No injury.  CERVICAL SPINE - COMPLETE 4+ VIEW  Comparison: None.  Findings: Loss of the normal cervical lordosis.  Cervical spondylotic changes with disc space narrowing and osteophyte formation C3-4 through C6-7.  Posterior spur C4-5 level. Minimal retrolisthesis C5.  Bony neural foraminal narrowing on the right at the C3-4 through C6- 7 level and maximal at the C5-6 and C6-7 level.  Bony neural foraminal narrowing on the left at the C3-4 through C6- 7 level and maximal at the C6-7 level.  Small cervical ribs.  Lung apices are clear.  Suggestion of right carotid bifurcation calcification which would be advanced for the patient's age.  IMPRESSION: Cervical spondylotic changes as noted above.   Original Report Authenticated By: Lacy Duverney, M.D.   Dg Shoulder Left  05/30/2013   *RADIOLOGY REPORT*  Clinical Data: Shoulder and neck pain.  No injury.  LEFT SHOULDER - 2+ VIEW  Comparison: None.  Findings: No fracture or dislocation.  No  significant degenerative changes.  No soft tissue calcifications.  Visualized lungs clear.  IMPRESSION: Negative.   Original Report Authenticated By: Lacy Duverney, M.D.   1. Degenerative disk disease   2. Cervical radiculopathy   3. Carotid artery calcification, right     MDM  Patients labs and/or radiological studies were viewed and considered during the medical decision making and disposition process. Discussed xray findings and pt viewed her films with me.  Also discussed finding with Dr. Clarene Duke prior to dc home.  Pt was referred to both neurosurgery and also vascular surgery for further evaluation and management of todays findings.  Pt understands need to f/u.  She was prescribed prednisone taper and hydrocodone.  Advised smoking cessation.  No neuro deficit on exam or by history to suggest emergent or surgical presentation.  Also discussed worsened sx that should prompt immediate  re-evaluation including distal weakness, bowel/bladder retention/incontinence.        Burgess Amor, PA-C 05/31/13 1244

## 2013-06-02 NOTE — ED Provider Notes (Signed)
Medical screening examination/treatment/procedure(s) were performed by non-physician practitioner and as supervising physician I was immediately available for consultation/collaboration.   Yun Gutierrez M Judith Demps, DO 06/02/13 1404 

## 2014-08-05 ENCOUNTER — Encounter (HOSPITAL_COMMUNITY): Payer: Self-pay | Admitting: Emergency Medicine

## 2014-08-05 ENCOUNTER — Emergency Department (HOSPITAL_COMMUNITY)
Admission: EM | Admit: 2014-08-05 | Discharge: 2014-08-05 | Disposition: A | Discharge status: Home / Self Care | Payer: Self-pay | Attending: Emergency Medicine | Admitting: Emergency Medicine

## 2014-08-05 ENCOUNTER — Emergency Department (HOSPITAL_COMMUNITY): Payer: Self-pay

## 2014-08-05 DIAGNOSIS — Z90710 Acquired absence of both cervix and uterus: Secondary | ICD-10-CM | POA: Insufficient documentation

## 2014-08-05 DIAGNOSIS — R079 Chest pain, unspecified: Secondary | ICD-10-CM | POA: Insufficient documentation

## 2014-08-05 DIAGNOSIS — M79602 Pain in left arm: Secondary | ICD-10-CM

## 2014-08-05 DIAGNOSIS — R109 Unspecified abdominal pain: Secondary | ICD-10-CM | POA: Insufficient documentation

## 2014-08-05 DIAGNOSIS — F17200 Nicotine dependence, unspecified, uncomplicated: Secondary | ICD-10-CM | POA: Insufficient documentation

## 2014-08-05 DIAGNOSIS — Z79899 Other long term (current) drug therapy: Secondary | ICD-10-CM | POA: Insufficient documentation

## 2014-08-05 DIAGNOSIS — M5412 Radiculopathy, cervical region: Secondary | ICD-10-CM | POA: Insufficient documentation

## 2014-08-05 DIAGNOSIS — R319 Hematuria, unspecified: Secondary | ICD-10-CM | POA: Insufficient documentation

## 2014-08-05 LAB — CBC WITH DIFFERENTIAL/PLATELET
Basophils Absolute: 0 10*3/uL (ref 0.0–0.1)
Basophils Relative: 0 % (ref 0–1)
Eosinophils Absolute: 0 10*3/uL (ref 0.0–0.7)
Eosinophils Relative: 0 % (ref 0–5)
HCT: 39.1 % (ref 36.0–46.0)
Hemoglobin: 13.3 g/dL (ref 12.0–15.0)
Lymphocytes Relative: 35 % (ref 12–46)
Lymphs Abs: 2.3 10*3/uL (ref 0.7–4.0)
MCH: 28.5 pg (ref 26.0–34.0)
MCHC: 34 g/dL (ref 30.0–36.0)
MCV: 83.7 fL (ref 78.0–100.0)
Monocytes Absolute: 0.4 10*3/uL (ref 0.1–1.0)
Monocytes Relative: 6 % (ref 3–12)
Neutro Abs: 3.9 10*3/uL (ref 1.7–7.7)
Neutrophils Relative %: 59 % (ref 43–77)
Platelets: 269 10*3/uL (ref 150–400)
RBC: 4.67 MIL/uL (ref 3.87–5.11)
RDW: 15.3 % (ref 11.5–15.5)
WBC: 6.6 10*3/uL (ref 4.0–10.5)

## 2014-08-05 LAB — URINALYSIS, ROUTINE W REFLEX MICROSCOPIC
Bilirubin Urine: NEGATIVE
Glucose, UA: NEGATIVE mg/dL
Hgb urine dipstick: NEGATIVE
Ketones, ur: NEGATIVE mg/dL
Leukocytes, UA: NEGATIVE
Nitrite: NEGATIVE
Protein, ur: NEGATIVE mg/dL
Specific Gravity, Urine: 1.01 (ref 1.005–1.030)
Urobilinogen, UA: 0.2 mg/dL (ref 0.0–1.0)
pH: 5.5 (ref 5.0–8.0)

## 2014-08-05 LAB — BASIC METABOLIC PANEL
Anion gap: 13 (ref 5–15)
BUN: 11 mg/dL (ref 6–23)
CO2: 24 mEq/L (ref 19–32)
Calcium: 9.1 mg/dL (ref 8.4–10.5)
Chloride: 102 mEq/L (ref 96–112)
Creatinine, Ser: 0.55 mg/dL (ref 0.50–1.10)
GFR calc Af Amer: >90 mL/min (ref >90)
GFR calc non Af Amer: >90 mL/min (ref >90)
Glucose, Bld: 106 mg/dL — ABNORMAL HIGH (ref 70–99)
Potassium: 3.6 mEq/L — ABNORMAL LOW (ref 3.7–5.3)
Sodium: 139 mEq/L (ref 137–147)

## 2014-08-05 LAB — TROPONIN I: Troponin I: <0.3 ng/mL (ref <0.30)

## 2014-08-05 MED ORDER — NAPROXEN 375 MG PO TABS: 375.0000 mg | ORAL_TABLET | Freq: Two times a day (BID) | ORAL | Status: DC | PRN

## 2014-08-05 MED ORDER — CYCLOBENZAPRINE HCL 10 MG PO TABS: 10.0000 mg | ORAL_TABLET | Freq: Three times a day (TID) | ORAL | Status: DC | PRN

## 2014-08-05 NOTE — ED Notes (Signed)
MD at bedside. 

## 2014-08-05 NOTE — ED Notes (Signed)
Pt noted blood on tissue after urination this morning, chest aching that radiates down left arm, denies SOB or N/V

## 2014-08-05 NOTE — ED Provider Notes (Signed)
CSN: [id number]     Arrival date & time 08/05/14  1043 History   This chart was scribed for Raeford Razor, MD, by Yevette Edwards, ED Scribe. This patient was seen in room APA15/APA15 and the patient's care was started at 12:41 PM.  First MD Initiated Contact with Patient 08/05/14 1205     Chief Complaint  Patient presents with  . Hematuria    The history is provided by the patient. No language interpreter was used.   HPI Comments: Ashley Rojas is a 48 y.o. female who presents to the Emergency Department complaining of hematuria which she first noticed upon tissue paper after micturation this morning. Ms. Tutor denies dysuria, fever, or chills. She endorses suprapubic pain which occurs only when she coughs. She also complains of intermittent chest pain which she characterizes as "aching" pain and which radiates down her left arm. She endorses a h/o left-sided neck pain reporting a diagnosis of cervical radiculopathy. The last episode of pain was approximately thirty minutes ago. She states that the symptoms have occurred intermittently for several months; she states the pain lasts for several minutes when it occurs. She denies SOB, dizziness, nausea, or diaphoresis associated with the pain. She also denies any factors which seem to worsen or improve the pain. Ms. Stang has a h/o abdominal surgery including a hysterectomy and colon resection. She also has a h/o UTI several years ago. Ms. Rish is a current smoker.    History reviewed. No pertinent past medical history. Past Surgical History  Procedure Laterality Date  . Abdominal hysterectomy    . Fracture surgery      R femur   History reviewed. No pertinent family history. History  Substance Use Topics  . Smoking status: Current Every Day Smoker -- 0.50 packs/day    Types: Cigarettes  . Smokeless tobacco: Not on file  . Alcohol Use: Yes     Comment: occasional   No OB history provided.  Review of Systems  Constitutional:  Negative for fever, chills and diaphoresis.  Respiratory: Positive for cough. Negative for shortness of breath.   Cardiovascular: Positive for chest pain.  Gastrointestinal: Positive for abdominal pain. Negative for nausea and vomiting.  Genitourinary: Positive for hematuria. Negative for dysuria.  Neurological: Negative for dizziness.    Allergies  Review of patient's allergies indicates no known allergies.  Home Medications   Prior to Admission medications   Medication Sig Start Date End Date Taking? Authorizing Provider  acetaminophen (TYLENOL) 500 MG tablet Take 1,000 mg by mouth every 6 (six) hours as needed for pain.   Yes Historical Provider, MD  Doxylamine Succinate, Sleep, (SLEEP AID PO) Take 1 tablet by mouth daily as needed (sleep).   Yes Historical Provider, MD  naphazoline-glycerin (CLEAR EYES) 0.012-0.2 % SOLN Place 1-2 drops into both eyes daily as needed for irritation.   Yes Historical Provider, MD   Triage Vitals: BP 135/80  Pulse 87  Temp(Src) 98.8 F (37.1 C) (Oral)  Resp 18  Ht  (1.702 m)  Wt 140 lb (63.504 kg)  BMI 21.92 kg/m2  SpO2 99%  Physical Exam  Nursing note and vitals reviewed. Constitutional: She is oriented to person, place, and time. She appears well-developed and well-nourished.  HENT:  Head: Normocephalic.  Eyes: EOM are normal.  Neck: Normal range of motion.  Cardiovascular: Normal rate, regular rhythm and normal heart sounds.   No murmur heard. Pulmonary/Chest: Effort normal and breath sounds normal. No respiratory distress. She has no wheezes.  Abdominal:  She exhibits no distension. There is tenderness. There is no rebound and no guarding.  Mild suprapubic tenderness. No rebound. No guarding. No CVA tenderness.   Musculoskeletal: Normal range of motion.  No neck, shoulder, arm tenderness.  Neurovascularly intact left upper extremity.   Neurological: She is alert and oriented to person, place, and time.  Psychiatric: She has a  normal mood and affect.    ED Course  Procedures (including critical care time)  DIAGNOSTIC STUDIES: Oxygen Saturation is 99% on room air, normal by my interpretation.    COORDINATION OF CARE:  12:50 PM- Discussed treatment plan with patient, and the patient agreed to the plan. The plan includes lab work.   Labs Review Labs Reviewed  BASIC METABOLIC PANEL - Abnormal; Notable for the following:    Potassium 3.6 (*)    Glucose, Bld 106 (*)    All other components within normal limits  URINALYSIS, ROUTINE W REFLEX MICROSCOPIC  CBC WITH DIFFERENTIAL  TROPONIN I    Imaging Review Dg Chest 2 View  08/05/2014   CLINICAL DATA:  Chest pain and abdominal pain.  Cough for 2 days  EXAM: CHEST  2 VIEW  COMPARISON:  None.  FINDINGS: The heart size and mediastinal contours are within normal limits. Both lungs are clear. The visualized skeletal structures are unremarkable.  IMPRESSION: No active cardiopulmonary disease.   Electronically Signed   By: Signa Kell M.D.   On: 08/05/2014 13:01     EKG Interpretation None      MDM   Final diagnoses:  Left arm pain  Hematuria  Left cervical radiculopathy     I personally preformed the services scribed in my presence. The recorded information has been reviewed is accurate. Raeford Razor, MD.     Raeford Razor, MD 08/14/14 [phone]

## 2014-08-05 NOTE — Discharge Instructions (Signed)

## 2014-11-12 ENCOUNTER — Encounter (HOSPITAL_COMMUNITY): Payer: Self-pay | Admitting: Emergency Medicine

## 2014-11-12 ENCOUNTER — Emergency Department (HOSPITAL_COMMUNITY)
Admission: EM | Admit: 2014-11-12 | Discharge: 2014-11-12 | Disposition: A | Discharge status: Home / Self Care | Payer: Self-pay | Attending: Emergency Medicine | Admitting: Emergency Medicine

## 2014-11-12 DIAGNOSIS — Z720 Tobacco use: Secondary | ICD-10-CM | POA: Insufficient documentation

## 2014-11-12 DIAGNOSIS — Z79899 Other long term (current) drug therapy: Secondary | ICD-10-CM | POA: Insufficient documentation

## 2014-11-12 DIAGNOSIS — J40 Bronchitis, not specified as acute or chronic: Secondary | ICD-10-CM

## 2014-11-12 DIAGNOSIS — Z791 Long term (current) use of non-steroidal anti-inflammatories (NSAID): Secondary | ICD-10-CM | POA: Insufficient documentation

## 2014-11-12 DIAGNOSIS — J209 Acute bronchitis, unspecified: Secondary | ICD-10-CM | POA: Insufficient documentation

## 2014-11-12 DIAGNOSIS — J069 Acute upper respiratory infection, unspecified: Secondary | ICD-10-CM | POA: Insufficient documentation

## 2014-11-12 MED ORDER — ALBUTEROL SULFATE HFA 108 (90 BASE) MCG/ACT IN AERS
2.0000 | INHALATION_SPRAY | Freq: Once | RESPIRATORY_TRACT | Status: AC
Start: 2014-11-12 — End: 2014-11-12
  Administered 2014-11-12: 2 via RESPIRATORY_TRACT
  Filled 2014-11-12: qty 6.7

## 2014-11-12 MED ORDER — CEPHALEXIN 500 MG PO CAPS
500.0000 mg | ORAL_CAPSULE | Freq: Once | ORAL | Status: AC
Start: 2014-11-12 — End: 2014-11-12
  Administered 2014-11-12: 500 mg via ORAL
  Filled 2014-11-12: qty 1

## 2014-11-12 MED ORDER — PREDNISONE 10 MG PO TABS: ORAL_TABLET | ORAL | Status: DC

## 2014-11-12 MED ORDER — KETOROLAC TROMETHAMINE 10 MG PO TABS: 10.0000 mg | ORAL_TABLET | Freq: Once | ORAL | Status: DC

## 2014-11-12 MED ORDER — PREDNISONE 50 MG PO TABS
60.0000 mg | ORAL_TABLET | Freq: Once | ORAL | Status: AC
  Administered 2014-11-12: 60 mg via ORAL
  Filled 2014-11-12 (×2): qty 1

## 2014-11-12 MED ORDER — CEPHALEXIN 250 MG PO CAPS: 250.0000 mg | ORAL_CAPSULE | Freq: Four times a day (QID) | ORAL | Status: DC

## 2014-11-12 NOTE — ED Provider Notes (Signed)
CSN: [id number]     Arrival date & time 11/12/14  1154 History  This chart was scribed for non-physician practitioner, Ivery QualeHobson Candise Crabtree, PA-C, working with Vanetta MuldersScott Zackowski, MD, by Ronney LionSuzanne Le, ED Scribe. This patient was seen in room APFT21/APFT21 and the patient's care was started at 1:08 PM.    Chief Complaint  Patient presents with  . Cough    Patient is a 48 y.o. female presenting with cough. The history is provided by the patient. No language interpreter was used.  Cough Cough characteristics:  Productive Sputum characteristics:  Bloody Severity:  Moderate Onset quality:  Sudden Duration:  2 days Associated symptoms: no fever     HPI Comments: Ashley Rojas is a 48 y.o. female who presents to the Emergency Department complaining of thick, blood-tinged productive cough that began 2 days ago. Breathing in causes pain in her center chest and back. Patient has a history of kidney stones. She denies fever and vomiting. She denies a history of lung surgery. Patient denies any known allergies to antibiotics. 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  History reviewed. No pertinent past medical history. Past Surgical History  Procedure Laterality Date  . Abdominal hysterectomy    . Fracture surgery      R femur   History reviewed. No pertinent family history. History  Substance Use Topics  . Smoking status: Current Every Day Smoker -- 0.50 packs/day    Types: Cigarettes  . Smokeless tobacco: Not on file  . Alcohol Use: Yes     Comment: occasional   OB History    No data available     Review of Systems  Constitutional: Negative for fever.  Respiratory: Positive for cough.   All other systems reviewed and are negative.     Allergies  Review of patient's allergies indicates no known allergies.  Home Medications   Prior to Admission medications   Medication Sig Start Date End Date Taking? Authorizing Provider  acetaminophen (TYLENOL) 500 MG tablet Take 1,000 mg by mouth every 6 (six)  hours as needed for pain.   Yes Historical Provider, MD  naphazoline-glycerin (CLEAR EYES) 0.012-0.2 % SOLN Place 1-2 drops into both eyes daily as needed for irritation.   Yes Historical Provider, MD  cyclobenzaprine (FLEXERIL) 10 MG tablet Take 1 tablet (10 mg total) by mouth 3 (three) times daily as needed for muscle spasms. Patient not taking: Reported on 11/12/2014 08/05/14   Raeford RazorStephen Kohut, MD  naproxen (NAPROSYN) 375 MG tablet Take 1 tablet (375 mg total) by mouth 2 (two) times daily as needed. Patient not taking: Reported on 11/12/2014 08/05/14   Raeford RazorStephen Kohut, MD   BP 108/86 mmHg  Pulse 88  Temp(Src) 98.4 F (36.9 C) (Oral)  Resp 20  Ht 5\' 6"  (1.676 m)  Wt 145 lb (65.772 kg)  BMI 23.41 kg/m2  SpO2 100% Physical Exam  Constitutional: She is oriented to person, place, and time. She appears well-developed and well-nourished. No distress.  HENT:  Head: Normocephalic and atraumatic.  Mouth/Throat: Oropharynx is clear and moist.  Nasal congestion present.  Eyes: Conjunctivae and EOM are normal.  Neck: Normal range of motion. Neck supple. No tracheal deviation present.  Cardiovascular: Normal rate and regular rhythm.   Pulmonary/Chest: Effort normal. No respiratory distress. She has wheezes.  Soft expiratory wheeze. Symmetrical rise and fall of the chest. Patient speaks in complete sentences.  Musculoskeletal: Normal range of motion. She exhibits no edema.  no edema of the lower extremities.  Lymphadenopathy:    She has  no cervical adenopathy.  Neurological: She is alert and oriented to person, place, and time.  Skin: Skin is warm and dry.  No rash of the palms.  Psychiatric: She has a normal mood and affect. Her behavior is normal.  Nursing note and vitals reviewed.   ED Course  Procedures (including critical care time)  DIAGNOSTIC STUDIES: Oxygen Saturation is 100% on room air, normal by my interpretation.    COORDINATION OF CARE: 1:11 PM - Discussed treatment plan  with pt at bedside which includes antibiotic and steroid and pt agreed to plan.   Labs Review Labs Reviewed - No data to display  Imaging Review No results found.   EKG Interpretation None      MDM  Exam is consistent with bronchitis and URI. Vital signs stable. Plan -  Keflex and prednisone given to the patient.  Pt to increase fluids. Pt to return if not improving.   Final diagnoses:  Bronchitis  URI (upper respiratory infection)     **I have reviewed nursing notes, vital signs, and all appropriate lab and imaging results for this patient.Kathie Dike*   Devlynn Knoff M Cammy Sanjurjo, PA-C 11/15/14 1309  Vanetta MuldersScott Zackowski, MD 11/19/14 1535

## 2014-11-12 NOTE — Discharge Instructions (Signed)
Upper Respiratory Infection, Adult An upper respiratory infection (URI) is also known as the common cold. It is often caused by a type of germ (virus). Colds are easily spread (contagious). You can pass it to others by kissing, coughing, sneezing, or drinking out of the same glass. Usually, you get better in 1 or 2 weeks.  HOME CARE   Only take medicine as told by your doctor.  Use a warm mist humidifier or breathe in steam from a hot shower.  Drink enough water and fluids to keep your pee (urine) clear or pale yellow.  Get plenty of rest.  Return to work when your temperature is back to normal or as told by your doctor. You may use a face mask and wash your hands to stop your cold from spreading. GET HELP RIGHT AWAY IF:   After the first few days, you feel you are getting worse.  You have questions about your medicine.  You have chills, shortness of breath, or brown or red spit (mucus).  You have yellow or brown snot (nasal discharge) or pain in the face, especially when you bend forward.  You have a fever, puffy (swollen) neck, pain when you swallow, or white spots in the back of your throat.  You have a bad headache, ear pain, sinus pain, or chest pain.  You have a high-pitched whistling sound when you breathe in and out (wheezing).  You have a lasting cough or cough up blood.  You have sore muscles or a stiff neck. MAKE SURE YOU:   Understand these instructions.  Will watch your condition.  Will get help right away if you are not doing well or get worse. Document Released: 04/27/2008 Document Revised: 02/01/2012 Document Reviewed: 02/14/2014 [employer] Patient Information 2015 [employer]. This information is not intended to replace advice given to you by your health care provider. Make sure you discuss any questions you have with your health care provider.  

## 2014-11-12 NOTE — ED Notes (Signed)
Pt reports generalized body aches, lower back pain,intermittent cough x2 days. Pt denies any known fevers.

## 2017-07-13 ENCOUNTER — Emergency Department (HOSPITAL_COMMUNITY): Payer: Self-pay

## 2017-07-13 ENCOUNTER — Encounter (HOSPITAL_COMMUNITY): Payer: Self-pay | Admitting: Cardiology

## 2017-07-13 ENCOUNTER — Emergency Department (HOSPITAL_COMMUNITY)
Admission: EM | Admit: 2017-07-13 | Discharge: 2017-07-13 | Disposition: A | Discharge status: Home / Self Care | Payer: Self-pay | Attending: Emergency Medicine | Admitting: Emergency Medicine

## 2017-07-13 DIAGNOSIS — M79602 Pain in left arm: Secondary | ICD-10-CM | POA: Insufficient documentation

## 2017-07-13 DIAGNOSIS — E876 Hypokalemia: Secondary | ICD-10-CM

## 2017-07-13 DIAGNOSIS — R0789 Other chest pain: Secondary | ICD-10-CM

## 2017-07-13 DIAGNOSIS — Z7982 Long term (current) use of aspirin: Secondary | ICD-10-CM | POA: Insufficient documentation

## 2017-07-13 DIAGNOSIS — F17210 Nicotine dependence, cigarettes, uncomplicated: Secondary | ICD-10-CM | POA: Insufficient documentation

## 2017-07-13 DIAGNOSIS — Z79899 Other long term (current) drug therapy: Secondary | ICD-10-CM | POA: Insufficient documentation

## 2017-07-13 LAB — CBC
HCT: 41.3 % (ref 36.0–46.0)
Hemoglobin: 13.7 g/dL (ref 12.0–15.0)
MCH: 27.8 pg (ref 26.0–34.0)
MCHC: 33.2 g/dL (ref 30.0–36.0)
MCV: 83.8 fL (ref 78.0–100.0)
Platelets: 163 10*3/uL (ref 150–400)
RBC: 4.93 MIL/uL (ref 3.87–5.11)
RDW: 16.5 % — ABNORMAL HIGH (ref 11.5–15.5)
WBC: 7.8 10*3/uL (ref 4.0–10.5)

## 2017-07-13 LAB — BASIC METABOLIC PANEL
Anion gap: 8 (ref 5–15)
BUN: 13 mg/dL (ref 6–20)
CO2: 32 mmol/L (ref 22–32)
Calcium: 9.2 mg/dL (ref 8.9–10.3)
Chloride: 99 mmol/L — ABNORMAL LOW (ref 101–111)
Creatinine, Ser: 0.63 mg/dL (ref 0.44–1.00)
GFR calc Af Amer: >60 mL/min (ref >60)
Glucose, Bld: 121 mg/dL — ABNORMAL HIGH (ref 65–99)
Potassium: 2.7 mmol/L — CL (ref 3.5–5.1)
Sodium: 139 mmol/L (ref 135–145)

## 2017-07-13 LAB — TROPONIN I: Troponin I: <0.03 ng/mL (ref <0.03)

## 2017-07-13 MED ORDER — POTASSIUM CHLORIDE IN NACL 20-0.9 MEQ/L-% IV SOLN
Freq: Once | INTRAVENOUS | Status: AC
  Administered 2017-07-13: 14:00:00 via INTRAVENOUS
  Filled 2017-07-13: qty 1000

## 2017-07-13 MED ORDER — POTASSIUM CHLORIDE CRYS ER 20 MEQ PO TBCR
60.0000 meq | EXTENDED_RELEASE_TABLET | Freq: Once | ORAL | Status: AC
  Administered 2017-07-13: 60 meq via ORAL
  Filled 2017-07-13: qty 3

## 2017-07-13 NOTE — ED Triage Notes (Addendum)
On and off left arm numbness and pain since Saturday.  Pt states that the pain is achy and goes up into her left shoulder and chest.   Also c/o right dental pain.

## 2017-07-13 NOTE — ED Provider Notes (Signed)
[facility]-EMERGENCY DEPT Provider Note   CSN: [id number] Arrival date & time: 07/13/17  1039     History   Chief Complaint Chief Complaint  Patient presents with  . Chest Pain    HPI Ashley Rojas is a 51 y.o. female.  HPI Patient presents with ongoing episodic left arm pressure, tingling. Past 3 days she has had multiple episodes that occur spontaneously, eased with rest and pressure application. Symptoms are persistent throughout the entire left arm from the shoulder inferiorly. Minimal superior left lateral chest pressure concurrently. Patient notes that prior to the onset of this illness, 3 days ago, the patient had 2 days of nausea, with vomiting, that occurred after a period of drinking in celebration for a wedding. Currently no nausea, no vomiting, no abdominal pain, no diarrhea, no fever, no chills. Patient denies history of cardiac disease, hypertension, diabetes. Patient takes aspirin, twice daily, 81 mg per  History reviewed. No pertinent past medical history.  There are no active problems to display for this patient.   Past Surgical History:  Procedure Laterality Date  . ABDOMINAL HYSTERECTOMY    . FRACTURE SURGERY     R femur    OB History    No data available       Home Medications    Prior to Admission medications   Medication Sig Start Date End Date Taking? Authorizing Provider  acetaminophen (TYLENOL) 500 MG tablet Take 1,000 mg by mouth every 6 (six) hours as needed for pain.   Yes [provider]  aspirin 81 MG chewable tablet Chew 81 mg by mouth 2 (two) times daily as needed for mild pain or moderate pain.   Yes [provider]  Aspirin-Acetaminophen-Caffeine (GOODY HEADACHE PO) Take 1 packet by mouth daily as needed (pain).   Yes [provider]    Family History History reviewed. No pertinent family history.  Social History Social History  Substance Use Topics  . Smoking status: Current Every Day Smoker   Packs/day: 0.50    Types: Cigarettes  . Smokeless tobacco: Not on file  . Alcohol use Yes     Comment: occasional     Allergies   Patient has no known allergies.   Review of Systems Review of Systems  Constitutional:       Per HPI, otherwise negative  HENT:       Per HPI, otherwise negative  Respiratory:       Per HPI, otherwise negative  Cardiovascular:       Per HPI, otherwise negative  Gastrointestinal: Negative for vomiting.  Endocrine:       Negative aside from HPI  Genitourinary:       Neg aside from HPI   Musculoskeletal:       Per HPI, otherwise negative  Skin: Negative.   Neurological: Negative for syncope.     Physical Exam Updated Vital Signs BP (!) 163/81 (BP Location: Left Arm)   Pulse (!) 57   Temp 98.4 F (36.9 C) (Oral)   Resp 17   Ht 5\' 7"  (1.702 m)   Wt 59.9 kg (132 lb)   SpO2 100%   BMI 20.67 kg/m   Physical Exam  Constitutional: She is oriented to person, place, and time. She appears well-developed and well-nourished. No distress.  HENT:  Head: Normocephalic and atraumatic.  Eyes: Conjunctivae and EOM are normal.  Cardiovascular: Normal rate, regular rhythm and intact distal pulses.   Pulmonary/Chest: Effort normal and breath sounds normal. No stridor. No respiratory distress.  Abdominal: She exhibits no distension.  Musculoskeletal: She exhibits no edema.  Neurological: She is alert and oriented to person, place, and time. She displays no atrophy and no tremor. No cranial nerve deficit. She exhibits normal muscle tone. She displays no seizure activity.  Skin: Skin is warm and dry.  Psychiatric: She has a normal mood and affect.  Nursing note and vitals reviewed.    ED Treatments / Results  Labs (all labs ordered are listed, but only abnormal results are displayed) Labs Reviewed  BASIC METABOLIC PANEL - Abnormal; Notable for the following:       Result Value   Potassium 2.7 (*)    Chloride 99 (*)    Glucose, Bld 121 (*)     All other components within normal limits  CBC - Abnormal; Notable for the following:    RDW 16.5 (*)    All other components within normal limits  TROPONIN I    EKG  EKG Interpretation  Date/Time:  Tuesday July 13 2017 10:50:11 EDT Ventricular Rate:  73 PR Interval:  176 QRS Duration: 96 QT Interval:  398 QTC Calculation: 438 R Axis:   60 Text Interpretation:  Normal sinus rhythm with sinus arrhythmia Minimal voltage criteria for LVH, may be normal variant mild u waves Abnormal ekg Confirmed by Gerhard Munch [phone]) on 07/13/2017 1:22:00 PM       Radiology Dg Chest 2 View  Result Date: 07/13/2017 CLINICAL DATA:  Left-sided chest pain, left arm pain and numbness EXAM: CHEST  2 VIEW COMPARISON:  08/05/2014 FINDINGS: The heart size and mediastinal contours are within normal limits. Both lungs are clear. The visualized skeletal structures are unremarkable. IMPRESSION: No active cardiopulmonary disease. Electronically Signed   By: Elige Ko   On: 07/13/2017 11:28    Procedures Procedures (including critical care time)  Medications Ordered in ED Medications  0.9 % NaCl with KCl 20 mEq/ L  infusion (not administered)  potassium chloride SA (K-DUR,KLOR-CON) CR tablet 60 mEq (not administered)     Initial Impression / Assessment and Plan / ED Course  I have reviewed the triage vital signs and the nursing notes.  Pertinent labs & imaging results that were available during my care of the patient were reviewed by me and considered in my medical decision making (see chart for details).  Initial labs notable for hypokalemia, and given the patient's description of paresthesia, intermittently throughout the left arm, as well as history of vomiting, there suspicion for this as self-induced. Patient will receive IV fluids, potassium supplement the IV and oral.  3:33 PM Patient awake and alert, smiling. No recurrent symptoms or pain. We discussed the importance of close  monitoring, outpatient follow-up for repeat potassium check. Patient relates that she has ongoing issues with her right posterior superior was time to this, some concern for infection or On exam, there is no evidence for infection, no erythema, no induration, no purulent discharge about the upper gums.  With no evidence for ACS, no pulmonary findings, no evidence for PE, and after repletion of her potassium, with oral and IV supplementation, with resolution of symptoms, the patient is appropriate for discharge.  Final Clinical Impressions(s) / ED Diagnoses  Atypical chest pain Hypokalemia   Gerhard Munch, MD 07/13/17 1534

## 2017-07-13 NOTE — Discharge Instructions (Signed)
As discussed, your evaluation today has been largely reassuring.  But, it is important that you monitor your condition carefully, and do not hesitate to return to the ED if you develop new, or concerning changes in your condition. ? ?Otherwise, please follow-up with your physician for appropriate ongoing care. ? ?

## 2017-07-13 NOTE — ED Notes (Signed)
Date and time results received: 07/13/17 12:43 PM  (use smartphrase ".now" to insert current time)  Test: K+ Critical Value: 2.7  Name of Provider Notified: Jeraldine Loots  Orders Received? Or Actions Taken?: Orders Received - See Orders for details

## 2017-07-13 NOTE — ED Notes (Signed)
Verified by Dr. Jeraldine Loots that Potassium should run over 1 hour.

## 2018-07-25 ENCOUNTER — Emergency Department (HOSPITAL_COMMUNITY): Payer: Self-pay

## 2018-07-25 ENCOUNTER — Emergency Department (HOSPITAL_COMMUNITY)
Admission: EM | Admit: 2018-07-25 | Discharge: 2018-07-25 | Disposition: A | Discharge status: Home / Self Care | Payer: Self-pay | Attending: Emergency Medicine | Admitting: Emergency Medicine

## 2018-07-25 ENCOUNTER — Encounter (HOSPITAL_COMMUNITY): Payer: Self-pay | Admitting: Emergency Medicine

## 2018-07-25 DIAGNOSIS — Z87442 Personal history of urinary calculi: Secondary | ICD-10-CM | POA: Insufficient documentation

## 2018-07-25 DIAGNOSIS — R1031 Right lower quadrant pain: Secondary | ICD-10-CM | POA: Insufficient documentation

## 2018-07-25 DIAGNOSIS — R109 Unspecified abdominal pain: Secondary | ICD-10-CM

## 2018-07-25 DIAGNOSIS — Z7982 Long term (current) use of aspirin: Secondary | ICD-10-CM | POA: Insufficient documentation

## 2018-07-25 DIAGNOSIS — F17210 Nicotine dependence, cigarettes, uncomplicated: Secondary | ICD-10-CM | POA: Insufficient documentation

## 2018-07-25 HISTORY — DX: Calculus of kidney: N20.0

## 2018-07-25 HISTORY — DX: Personal history of other diseases of the digestive system: Z87.19

## 2018-07-25 LAB — COMPREHENSIVE METABOLIC PANEL
ALT: 14 U/L (ref 0–44)
AST: 16 U/L (ref 15–41)
Albumin: 3.7 g/dL (ref 3.5–5.0)
Alkaline Phosphatase: 60 U/L (ref 38–126)
Anion gap: 7 (ref 5–15)
BUN: 10 mg/dL (ref 6–20)
CHLORIDE: 108 mmol/L (ref 98–111)
CO2: 28 mmol/L (ref 22–32)
Calcium: 9.1 mg/dL (ref 8.9–10.3)
Creatinine, Ser: 0.63 mg/dL (ref 0.44–1.00)
GFR calc non Af Amer: >60 mL/min (ref >60)
Glucose, Bld: 108 mg/dL — ABNORMAL HIGH (ref 70–99)
Potassium: 3.6 mmol/L (ref 3.5–5.1)
SODIUM: 143 mmol/L (ref 135–145)
Total Bilirubin: 0.4 mg/dL (ref 0.3–1.2)
Total Protein: 6.8 g/dL (ref 6.5–8.1)

## 2018-07-25 LAB — URINALYSIS, ROUTINE W REFLEX MICROSCOPIC
Bilirubin Urine: NEGATIVE
Glucose, UA: NEGATIVE mg/dL
HGB URINE DIPSTICK: NEGATIVE
Ketones, ur: NEGATIVE mg/dL
Leukocytes, UA: NEGATIVE
Nitrite: NEGATIVE
PROTEIN: NEGATIVE mg/dL
Specific Gravity, Urine: 1.016 (ref 1.005–1.030)
pH: 5 (ref 5.0–8.0)

## 2018-07-25 LAB — CBC
HCT: 38.1 % (ref 36.0–46.0)
Hemoglobin: 12.2 g/dL (ref 12.0–15.0)
MCH: 27.9 pg (ref 26.0–34.0)
MCHC: 32 g/dL (ref 30.0–36.0)
MCV: 87 fL (ref 78.0–100.0)
PLATELETS: 282 10*3/uL (ref 150–400)
RBC: 4.38 MIL/uL (ref 3.87–5.11)
RDW: 15.9 % — AB (ref 11.5–15.5)
WBC: 6.5 10*3/uL (ref 4.0–10.5)

## 2018-07-25 LAB — LIPASE, BLOOD: Lipase: 26 U/L (ref 11–51)

## 2018-07-25 MED ORDER — MORPHINE SULFATE (PF) 4 MG/ML IV SOLN
4.0000 mg | Freq: Once | INTRAVENOUS | Status: AC
  Administered 2018-07-25: 4 mg via INTRAMUSCULAR
  Filled 2018-07-25: qty 1

## 2018-07-25 MED ORDER — NAPROXEN 250 MG PO TABS: 250.0000 mg | ORAL_TABLET | Freq: Two times a day (BID) | ORAL | 0 refills | Status: DC | PRN

## 2018-07-25 MED ORDER — ONDANSETRON 4 MG PO TBDP
4.0000 mg | ORAL_TABLET | Freq: Once | ORAL | Status: AC | PRN
  Administered 2018-07-25: 4 mg via ORAL
  Filled 2018-07-25: qty 1

## 2018-07-25 MED ORDER — METHOCARBAMOL 500 MG PO TABS: 1000.0000 mg | ORAL_TABLET | Freq: Four times a day (QID) | ORAL | 0 refills | Status: DC | PRN

## 2018-07-25 NOTE — ED Provider Notes (Signed)
[facility] EMERGENCY DEPARTMENT Provider Note   CSN: [id number] Arrival date & time: 07/25/18  1258     History   Chief Complaint Chief Complaint  Patient presents with  . Abdominal Pain    HPI Ashley Rojas is a 52 y.o. female.  HPI  Pt was seen at 1455. Per pt, c/o sudden onset and persistence of constant right sided flank "pain" that began 3 days ago.  Pt describes the pain as "sore," and radiating into the right side of her abd.  Has been associated with no other symptoms.  Denies dysuria/hematuria, , no vaginal bleeding/discharge, no N/V, no diarrhea, no black or blood in stools, no CP/SOB, no fevers, no rash.    Past Medical History:  Diagnosis Date  . History of ileus 2004   post-op   . Kidney stone     There are no active problems to display for this patient.   Past Surgical History:  Procedure Laterality Date  . ABDOMINAL HYSTERECTOMY    . COLON SURGERY    . FRACTURE SURGERY     R femur     OB History   None      Home Medications    Prior to Admission medications   Medication Sig Start Date End Date Taking? Authorizing Provider  acetaminophen (TYLENOL) 500 MG tablet Take 1,000 mg by mouth every 6 (six) hours as needed for pain.   Yes [provider]  aspirin 81 MG chewable tablet Chew 81 mg by mouth 2 (two) times daily as needed for mild pain or moderate pain.   Yes [provider]  Aspirin-Acetaminophen-Caffeine (GOODY HEADACHE PO) Take 1 packet by mouth daily as needed (pain).   Yes [provider]  ibuprofen (ADVIL,MOTRIN) 200 MG tablet Take 800 mg by mouth every 6 (six) hours as needed.   Yes [provider]    Family History History reviewed. No pertinent family history.  Social History Social History   Tobacco Use  . Smoking status: Current Every Day Smoker    Packs/day: 0.50    Types: Cigarettes  Substance Use Topics  . Alcohol use: Yes    Comment: occasional  . Drug use: Yes    Types:  Marijuana    Comment: occasionally     Allergies   Patient has no known allergies.   Review of Systems Review of Systems ROS: Statement: All systems negative except as marked or noted in the HPI; Constitutional: Negative for fever and chills. ; ; Eyes: Negative for eye pain, redness and discharge. ; ; ENMT: Negative for ear pain, hoarseness, nasal congestion, sinus pressure and sore throat. ; ; Cardiovascular: Negative for chest pain, palpitations, diaphoresis, dyspnea and peripheral edema. ; ; Respiratory: Negative for cough, wheezing and stridor. ; ; Gastrointestinal: Negative for nausea, vomiting, diarrhea, abdominal pain, blood in stool, hematemesis, jaundice and rectal bleeding. . ; ; Genitourinary: +flank pain. Negative for dysuria and hematuria. ; ; GYN:  No pelvic pain, no vaginal bleeding, no vaginal discharge, no vulvar pain. ;; Musculoskeletal: Negative for back pain and neck pain. Negative for swelling and trauma.; ; Skin: Negative for pruritus, rash, abrasions, blisters, bruising and skin lesion.; ; Neuro: Negative for headache, lightheadedness and neck stiffness. Negative for weakness, altered level of consciousness, altered mental status, extremity weakness, paresthesias, involuntary movement, seizure and syncope.       Physical Exam Updated Vital Signs BP (!) 141/82 (BP Location: Right Arm)   Pulse 85   Temp 98 F (36.7 C) (  Oral)   Resp 16   Ht 5\' 7"  (1.702 m)   Wt 64.4 kg   SpO2 100%   BMI 22.24 kg/m   Physical Exam 1500: Physical examination:  Nursing notes reviewed; Vital signs and O2 SAT reviewed;  Constitutional: Well developed, Well nourished, Well hydrated, In no acute distress; Head:  Normocephalic, atraumatic; Eyes: EOMI, PERRL, No scleral icterus; ENMT: Mouth and pharynx normal, Mucous membranes moist; Neck: Supple, Full range of motion, No lymphadenopathy; Cardiovascular: Regular rate and rhythm, No gallop; Respiratory: Breath sounds clear & equal  bilaterally, No wheezes.  Speaking full sentences with ease, Normal respiratory effort/excursion; Chest: Nontender, Movement normal; Abdomen: Soft, +entire right sided abd tender to palp. No rebound or guarding. No rash. Nondistended, Normal bowel sounds; Genitourinary: No CVA tenderness; Spine:  No midline CS, TS, LS tenderness. +TTP right lumbar paraspinal muscles.;;  Extremities: Peripheral pulses normal, No tenderness, No edema, No calf edema or asymmetry.; Neuro: AA&Ox3, Major CN grossly intact.  Speech clear. No gross focal motor or sensory deficits in extremities.; Skin: Color normal, Warm, Dry.   ED Treatments / Results  Labs (all labs ordered are listed, but only abnormal results are displayed)   EKG None  Radiology   Procedures Procedures (including critical care time)  Medications Ordered in ED Medications  morphine 4 MG/ML injection 4 mg (has no administration in time range)  ondansetron (ZOFRAN-ODT) disintegrating tablet 4 mg (has no administration in time range)     Initial Impression / Assessment and Plan / ED Course  I have reviewed the triage vital signs and the nursing notes.  Pertinent labs & imaging results that were available during my care of the patient were reviewed by me and considered in my medical decision making (see chart for details).  MDM Reviewed: previous chart, nursing note and vitals Reviewed previous: labs and CT scan Interpretation: labs and CT scan   Results for orders placed or performed during the hospital encounter of 07/25/18  Lipase, blood  Result Value Ref Range   Lipase 26 11 - 51 U/L  Comprehensive metabolic panel  Result Value Ref Range   Sodium 143 135 - 145 mmol/L   Potassium 3.6 3.5 - 5.1 mmol/L   Chloride 108 98 - 111 mmol/L   CO2 28 22 - 32 mmol/L   Glucose, Bld 108 (H) 70 - 99 mg/dL   BUN 10 6 - 20 mg/dL   Creatinine, Ser 1.61 0.44 - 1.00 mg/dL   Calcium 9.1 8.9 - 09.6 mg/dL   Total Protein 6.8 6.5 - 8.1 g/dL    Albumin 3.7 3.5 - 5.0 g/dL   AST 16 15 - 41 U/L   ALT 14 0 - 44 U/L   Alkaline Phosphatase 60 38 - 126 U/L   Total Bilirubin 0.4 0.3 - 1.2 mg/dL   GFR calc non Af Amer >60 >60 mL/min   GFR calc Af Amer >60 >60 mL/min   Anion gap 7 5 - 15  CBC  Result Value Ref Range   WBC 6.5 4.0 - 10.5 K/uL   RBC 4.38 3.87 - 5.11 MIL/uL   Hemoglobin 12.2 12.0 - 15.0 g/dL   HCT 04.5 40.9 - 81.1 %   MCV 87.0 78.0 - 100.0 fL   MCH 27.9 26.0 - 34.0 pg   MCHC 32.0 30.0 - 36.0 g/dL   RDW 91.4 (H) 78.2 - 95.6 %   Platelets 282 150 - 400 K/uL  Urinalysis, Routine w reflex microscopic  Result Value Ref Range  Color, Urine YELLOW YELLOW   APPearance CLEAR CLEAR   Specific Gravity, Urine 1.016 1.005 - 1.030   pH 5.0 5.0 - 8.0   Glucose, UA NEGATIVE NEGATIVE mg/dL   Hgb urine dipstick NEGATIVE NEGATIVE   Bilirubin Urine NEGATIVE NEGATIVE   Ketones, ur NEGATIVE NEGATIVE mg/dL   Protein, ur NEGATIVE NEGATIVE mg/dL   Nitrite NEGATIVE NEGATIVE   Leukocytes, UA NEGATIVE NEGATIVE   Ct Renal Stone Study Result Date: 07/25/2018 CLINICAL DATA:  Right flank pain EXAM: CT ABDOMEN AND PELVIS WITHOUT CONTRAST TECHNIQUE: Multidetector CT imaging of the abdomen and pelvis was performed following the standard protocol without IV contrast. COMPARISON:  11/24/2005 FINDINGS: Lower chest: Lung bases demonstrate no acute consolidation or pleural effusion. Heart size upper limits of normal. Coronary vascular calcification Hepatobiliary: No focal liver abnormality is seen. No gallstones, gallbladder wall thickening, or biliary dilatation. Pancreas: Unremarkable. No pancreatic ductal dilatation or surrounding inflammatory changes. Spleen: Normal in size without focal abnormality. Adrenals/Urinary Tract: Right adrenal gland is normal. 2.5 cm low-attenuation mass left adrenal gland, likely adenoma, increased compared to prior. No calcified stone. No hydronephrosis. No definite ureteral stone. Bladder normal Stomach/Bowel: Stomach  nonenlarged. No dilated small bowel. Postsurgical changes in the right lower quadrant and cecum. Pill fragment or fecalith in the cecum. Appendix not clearly seen but no right lower quadrant inflammation. Vascular/Lymphatic: Moderate aortic atherosclerosis. No aneurysm. No significantly enlarged lymph nodes. Left retroaortic renal vein. Reproductive: Status post hysterectomy. No adnexal masses. Other: Negative for free air or free fluid. Fat in the umbilical region. Musculoskeletal: No acute or significant osseous findings. Partially visible hardware in the right femur IMPRESSION: 1. Negative for hydronephrosis or ureteral stone 2. Increased size of a left adrenal gland mass now measuring 2.5 cm, most likely still representing adenoma Electronically Signed   By: Jasmine Pang M.D.   On: 07/25/2018 15:51    1715:  Pt has tol PO well while in the ED without N/V.  No stooling while in the ED.  Abd benign, VSS. Feels better and wants to go home now. Workup reassuring. Tx symptomatically at this time. Dx and testing d/w pt.  Questions answered.  Verb understanding, agreeable to d/c home with outpt f/u.      Final Clinical Impressions(s) / ED Diagnoses   Final diagnoses:  None    ED Discharge Orders    None       Samuel Jester, DO 07/29/18 0719

## 2018-07-25 NOTE — Discharge Instructions (Addendum)
Your CT scan showed an incidental finding: "Increased size of a left adrenal gland mass now measuring 2.5 cm, most likely still representing adenoma." Your regular medical doctor can follow up this known finding. Take the prescriptions as directed.  Apply moist heat or ice to the area(s) of discomfort, for 15 minutes at a time, several times per day for the next few days.  Do not fall asleep on a heating or ice pack.  Call your regular medical doctor tomorrow to schedule a follow up appointment this week.  Return to the Emergency Department immediately if worsening.

## 2018-07-25 NOTE — ED Triage Notes (Signed)
Pt reports having pain on right side since Friday moving around to groin/lower right quad.  States it is very sore to the touch and denies n/v/d.

## 2018-07-27 LAB — URINE CULTURE

## 2019-05-08 ENCOUNTER — Emergency Department (HOSPITAL_COMMUNITY)
Admission: EM | Admit: 2019-05-08 | Discharge: 2019-05-08 | Disposition: A | Discharge status: Home / Self Care | Payer: Self-pay | Attending: Emergency Medicine | Admitting: Emergency Medicine

## 2019-05-08 ENCOUNTER — Emergency Department (HOSPITAL_COMMUNITY): Payer: Self-pay

## 2019-05-08 ENCOUNTER — Other Ambulatory Visit: Payer: Self-pay

## 2019-05-08 ENCOUNTER — Encounter (HOSPITAL_COMMUNITY): Payer: Self-pay

## 2019-05-08 DIAGNOSIS — F17210 Nicotine dependence, cigarettes, uncomplicated: Secondary | ICD-10-CM | POA: Insufficient documentation

## 2019-05-08 DIAGNOSIS — R51 Headache: Secondary | ICD-10-CM | POA: Insufficient documentation

## 2019-05-08 DIAGNOSIS — R200 Anesthesia of skin: Secondary | ICD-10-CM | POA: Insufficient documentation

## 2019-05-08 LAB — CBC
HCT: 38.6 % (ref 36.0–46.0)
Hemoglobin: 12.3 g/dL (ref 12.0–15.0)
MCH: 27.5 pg (ref 26.0–34.0)
MCHC: 31.9 g/dL (ref 30.0–36.0)
MCV: 86.2 fL (ref 80.0–100.0)
Platelets: 221 10*3/uL (ref 150–400)
RBC: 4.48 MIL/uL (ref 3.87–5.11)
RDW: 17.9 % — ABNORMAL HIGH (ref 11.5–15.5)
WBC: 7.1 10*3/uL (ref 4.0–10.5)
nRBC: 0 % (ref 0.0–0.2)

## 2019-05-08 LAB — COMPREHENSIVE METABOLIC PANEL
ALT: 14 U/L (ref 0–44)
AST: 16 U/L (ref 15–41)
Albumin: 3.6 g/dL (ref 3.5–5.0)
Alkaline Phosphatase: 71 U/L (ref 38–126)
Anion gap: 7 (ref 5–15)
BUN: 10 mg/dL (ref 6–20)
CO2: 24 mmol/L (ref 22–32)
Calcium: 8.9 mg/dL (ref 8.9–10.3)
Chloride: 111 mmol/L (ref 98–111)
Creatinine, Ser: 0.49 mg/dL (ref 0.44–1.00)
GFR calc Af Amer: >60 mL/min (ref >60)
GFR calc non Af Amer: >60 mL/min (ref >60)
Glucose, Bld: 96 mg/dL (ref 70–99)
Potassium: 3.7 mmol/L (ref 3.5–5.1)
Sodium: 142 mmol/L (ref 135–145)
Total Bilirubin: 0.6 mg/dL (ref 0.3–1.2)
Total Protein: 6.8 g/dL (ref 6.5–8.1)

## 2019-05-08 LAB — DIFFERENTIAL
Abs Immature Granulocytes: 0.02 10*3/uL (ref 0.00–0.07)
Basophils Absolute: 0 10*3/uL (ref 0.0–0.1)
Basophils Relative: 0 %
Eosinophils Absolute: 0.1 10*3/uL (ref 0.0–0.5)
Eosinophils Relative: 1 %
Immature Granulocytes: 0 %
Lymphocytes Relative: 42 %
Lymphs Abs: 3 10*3/uL (ref 0.7–4.0)
Monocytes Absolute: 0.5 10*3/uL (ref 0.1–1.0)
Monocytes Relative: 7 %
Neutro Abs: 3.5 10*3/uL (ref 1.7–7.7)
Neutrophils Relative %: 50 %

## 2019-05-08 LAB — URINALYSIS, ROUTINE W REFLEX MICROSCOPIC
Bilirubin Urine: NEGATIVE
Glucose, UA: NEGATIVE mg/dL
Hgb urine dipstick: NEGATIVE
Ketones, ur: NEGATIVE mg/dL
Leukocytes,Ua: NEGATIVE
Nitrite: NEGATIVE
Protein, ur: NEGATIVE mg/dL
Specific Gravity, Urine: 1.01 (ref 1.005–1.030)
pH: 5 (ref 5.0–8.0)

## 2019-05-08 LAB — APTT: aPTT: 25 seconds (ref 24–36)

## 2019-05-08 LAB — RAPID URINE DRUG SCREEN, HOSP PERFORMED
Amphetamines: NOT DETECTED
Barbiturates: NOT DETECTED
Benzodiazepines: NOT DETECTED
Cocaine: NOT DETECTED
Opiates: NOT DETECTED
Tetrahydrocannabinol: POSITIVE — AB

## 2019-05-08 LAB — PROTIME-INR
INR: 0.9 (ref 0.8–1.2)
Prothrombin Time: 12.4 seconds (ref 11.4–15.2)

## 2019-05-08 LAB — ETHANOL: Alcohol, Ethyl (B): <10 mg/dL (ref <10)

## 2019-05-08 LAB — POC URINE PREG, ED: Preg Test, Ur: NEGATIVE

## 2019-05-08 MED ORDER — ACETAMINOPHEN 325 MG PO TABS
650.0000 mg | ORAL_TABLET | Freq: Once | ORAL | Status: AC
  Administered 2019-05-08: 16:00:00 650 mg via ORAL
  Filled 2019-05-08: qty 2

## 2019-05-08 NOTE — ED Triage Notes (Signed)
Pt reports that she woke up approx 0430 this morning with numbness and tingling. Pt reports decrease sensation to left arm and leg. No other deficits noted . Pt reports HA this morning and took [facility] and now HA has resolved. Pt reports taking 2 baby asa this am

## 2019-05-08 NOTE — ED Notes (Signed)
Patient to CT.

## 2019-05-08 NOTE — ED Provider Notes (Addendum)
[facility] EMERGENCY DEPARTMENT Provider Note   CSN: [id number] Arrival date & time: 05/08/19  1107     History   Chief Complaint Chief Complaint  Patient presents with  . Numbness    HPI Ashley Rojas is a 53 y.o. female.     Patient with acute onset this morning at about 6 in the morning Ashley Rojas was already at work had been up for about an hour.  Of left-sided numbness of upper upper extremity and lower extremity no weakness no visual changes no speech problems.  No fevers no upper respiratory infections no headache no chest pain no shortness of breath no abdominal pain.     Past Medical History:  Diagnosis Date  . History of ileus 2004   post-op   . Kidney stone    stones    There are no active problems to display for this patient.   Past Surgical History:  Procedure Laterality Date  . ABDOMINAL HYSTERECTOMY    . COLON SURGERY    . FRACTURE SURGERY     R femur     OB History   No obstetric history on file.      Home Medications    Prior to Admission medications   Medication Sig Start Date End Date Taking? Authorizing Provider  aspirin 81 MG chewable tablet Chew 162 mg by mouth 2 (two) times daily as needed for mild pain or moderate pain.    Yes [provider]  Aspirin-Acetaminophen-Caffeine (GOODY HEADACHE PO) Take 1 packet by mouth daily as needed (pain).   Yes [provider]  methocarbamol (ROBAXIN) 500 MG tablet Take 2 tablets (1,000 mg total) by mouth 4 (four) times daily as needed for muscle spasms (muscle spasm/pain). Patient not taking: Reported on 05/08/2019 07/25/18   Francine Graven, DO  naproxen (NAPROSYN) 250 MG tablet Take 1 tablet (250 mg total) by mouth 2 (two) times daily as needed for mild pain or moderate pain (take with food). Patient not taking: Reported on 05/08/2019 07/25/18   Francine Graven, DO    Family History No family history on file.  Social History Social History   Tobacco Use  . Smoking status:  Current Every Day Smoker    Packs/day: 0.50    Types: Cigarettes  Substance Use Topics  . Alcohol use: Yes    Comment: occasional  . Drug use: Yes    Types: Marijuana    Comment: occasionally     Allergies   Patient has no known allergies.   Review of Systems Review of Systems  Constitutional: Negative for chills and fever.  HENT: Negative for rhinorrhea and sore throat.   Eyes: Negative for visual disturbance.  Respiratory: Negative for cough and shortness of breath.   Cardiovascular: Negative for chest pain and leg swelling.  Gastrointestinal: Negative for abdominal pain, diarrhea, nausea and vomiting.  Genitourinary: Negative for dysuria.  Musculoskeletal: Negative for back pain and neck pain.  Skin: Negative for rash.  Neurological: Positive for numbness. Negative for dizziness, light-headedness and headaches.  Hematological: Does not bruise/bleed easily.  Psychiatric/Behavioral: Negative for confusion.     Physical Exam Updated Vital Signs BP (!) 164/90   Pulse 68   Temp 99 F (37.2 C) (Oral)   Resp 15   Ht 1.676 m (5\' 6" )   Wt 68.9 kg   SpO2 97%   BMI 24.53 kg/m   Physical Exam Vitals signs and nursing note reviewed.  Constitutional:      General: Ashley Rojas is not in  acute distress.    Appearance: Normal appearance. Ashley Rojas is well-developed.  HENT:     Head: Normocephalic and atraumatic.  Eyes:     Extraocular Movements: Extraocular movements intact.     Conjunctiva/sclera: Conjunctivae normal.     Pupils: Pupils are equal, round, and reactive to light.  Neck:     Musculoskeletal: Normal range of motion and neck supple.  Cardiovascular:     Rate and Rhythm: Normal rate and regular rhythm.     Heart sounds: Normal heart sounds. No murmur.  Pulmonary:     Effort: Pulmonary effort is normal. No respiratory distress.     Breath sounds: Normal breath sounds.  Abdominal:     General: Bowel sounds are normal.     Palpations: Abdomen is soft.     Tenderness:  There is no abdominal tenderness.  Musculoskeletal: Normal range of motion.        General: No swelling or tenderness.  Skin:    General: Skin is warm and dry.     Capillary Refill: Capillary refill takes less than 2 seconds.  Neurological:     Mental Status: Ashley Rojas is alert and oriented to person, place, and time.     Cranial Nerves: No cranial nerve deficit.     Sensory: Sensory deficit present.     Motor: No weakness.     Coordination: Coordination normal.      ED Treatments / Results  Labs (all labs ordered are listed, but only abnormal results are displayed) Labs Reviewed  CBC - Abnormal; Notable for the following components:      Result Value   RDW 17.9 (*)    All other components within normal limits  RAPID URINE DRUG SCREEN, HOSP PERFORMED - Abnormal; Notable for the following components:   Tetrahydrocannabinol POSITIVE (*)    All other components within normal limits  ETHANOL  PROTIME-INR  APTT  DIFFERENTIAL  COMPREHENSIVE METABOLIC PANEL  URINALYSIS, ROUTINE W REFLEX MICROSCOPIC  I-STAT CHEM 8, ED  POC URINE PREG, ED    EKG EKG Interpretation  Date/Time:  Monday May 08 2019 11:35:53 EDT Ventricular Rate:  72 PR Interval:    QRS Duration: 90 QT Interval:  385 QTC Calculation: 422 R Axis:   67 Text Interpretation:  Sinus rhythm Nonspecific T abnrm, anterolateral leads No significant change since last tracing Confirmed by Vanetta MuldersZackowski, Charlissa Petros [phone]([id number]) on 05/08/2019 11:40:40 AM   Radiology Ct Head Wo Contrast  Result Date: 05/08/2019 CLINICAL DATA:  Headache numbness left hand EXAM: CT HEAD WITHOUT CONTRAST TECHNIQUE: Contiguous axial images were obtained from the base of the skull through the vertex without intravenous contrast. COMPARISON:  None. FINDINGS: Brain: Ventricle size and cerebral volume normal. Negative for acute infarct, hemorrhage, mass. Vascular: Negative for hyperdense vessel Skull: Negative Sinuses/Orbits: Negative Other: None IMPRESSION: Negative  CT head. Electronically Signed   By: Marlan Palauharles  Clark M.D.   On: 05/08/2019 13:54    Procedures Procedures (including critical care time)   CRITICAL CARE Performed by: Vanetta MuldersScott Jahnaya Branscome Total critical care time: 30 minutes Critical care time was exclusive of separately billable procedures and treating other patients. Critical care was necessary to treat or prevent imminent or life-threatening deterioration. Critical care was time spent personally by me on the following activities: development of treatment plan with patient and/or surrogate as well as nursing, discussions with consultants, evaluation of patient's response to treatment, examination of patient, obtaining history from patient or surrogate, ordering and performing treatments and interventions, ordering and review of laboratory studies,  ordering and review of radiographic studies, pulse oximetry and re-evaluation of patient's condition.  Medications Ordered in ED Medications - No data to display   Initial Impression / Assessment and Plan / ED Course  I have reviewed the triage vital signs and the nursing notes.  Pertinent labs & imaging results that were available during my care of the patient were reviewed by me and considered in my medical decision making (see chart for details).        Patient clinically with some concerns for CVA.  Patient not a TPA candidate and that there is no weakness or any significant focal findings.  Head CT without any acute findings.  Patient is undergoing MRI to further delineate the symptoms.  If negative patient can be discharged home and follow-up with neurology.  If positive patient will probably require admission we do not have neurology coverage here would need to talk to neuro hospitalist and then probably have the hospitalist [facility].  Just got a call from MRI.  There appears to be an abnormality on the scan so patient will get MRA done.  Final read for MRI was negative for  any acute stroke or explanation for the left-sided numbness.  Will discharge patient home and have her follow-up with neurology.  Work note provided.  Final Clinical Impressions(s) / ED Diagnoses   Final diagnoses:  Numbness    ED Discharge Orders    None       Vanetta MuldersZackowski, Jaidin Richison, MD 05/08/19 1536    Vanetta MuldersZackowski, Borden Thune, MD 05/08/19 1538    Vanetta MuldersZackowski, Anelis Hrivnak, MD 05/08/19 [id number]

## 2019-05-08 NOTE — Discharge Instructions (Signed)
Follow-up with neurology, Dr. Merlene Laughter, call and make an appointment.  MRI today showed no evidence of stroke.  Or any explanation for the left-sided numbness.  But it is important that she follow-up with them.  Safe to go home.  Work note provided.  Return for any new or worse symptoms.

## 2019-05-12 ENCOUNTER — Emergency Department (HOSPITAL_COMMUNITY)
Admission: EM | Admit: 2019-05-12 | Discharge: 2019-05-12 | Disposition: A | Discharge status: Home / Self Care | Payer: Self-pay | Attending: Emergency Medicine | Admitting: Emergency Medicine

## 2019-05-12 ENCOUNTER — Other Ambulatory Visit: Payer: Self-pay

## 2019-05-12 ENCOUNTER — Encounter (HOSPITAL_COMMUNITY): Payer: Self-pay | Admitting: *Deleted

## 2019-05-12 DIAGNOSIS — G629 Polyneuropathy, unspecified: Secondary | ICD-10-CM

## 2019-05-12 DIAGNOSIS — F17210 Nicotine dependence, cigarettes, uncomplicated: Secondary | ICD-10-CM | POA: Insufficient documentation

## 2019-05-12 DIAGNOSIS — G589 Mononeuropathy, unspecified: Secondary | ICD-10-CM | POA: Insufficient documentation

## 2019-05-12 MED ORDER — DEXAMETHASONE SODIUM PHOSPHATE 4 MG/ML IJ SOLN
10.0000 mg | Freq: Once | INTRAMUSCULAR | Status: AC
  Administered 2019-05-12: 10 mg via INTRAMUSCULAR
  Filled 2019-05-12: qty 3

## 2019-05-12 MED ORDER — GABAPENTIN 300 MG PO CAPS: 300.0000 mg | ORAL_CAPSULE | Freq: Two times a day (BID) | ORAL | 0 refills | Status: DC

## 2019-05-12 NOTE — Discharge Instructions (Signed)
You were evaluated in the emergency department today with left leg pain.  I suspect that this is neuropathy type pain.  I am writing a short prescription for gabapentin.  Please contact your primary care physician as this will likely need to be extended and increased based on your symptoms.  Please follow-up with Dr. Merlene Laughter as scheduled.  Return to the emergency department with any new or suddenly worsening symptoms.

## 2019-05-12 NOTE — ED Provider Notes (Signed)
Emergency Department Provider Note   I have reviewed the triage vital signs and the nursing notes.   HISTORY  Chief Complaint Leg Pain   HPI Ashley Rojas is a 53 y.o. female presents to the ED for evaluation of left leg pain.  Patient was seen 3 days ago in the emergency department with numbness on the left side of her body.  She had MRI and MRA of the head at that time which was negative for stroke.  She states that she was having burning pain in her foot on the left during that presentation but over the last several days her burning type pain has worsened and spread up her leg.  She is not having any symptoms in the right leg.  No swelling or redness of the leg.  No chest pain or shortness of breath.  No burning discomfort in the left upper extremity.  No history of neuropathy.  No falls or head injuries.  She has scheduled a follow-up appointment with Dr. Merlene Laughter for June 29.   Past Medical History:  Diagnosis Date  . History of ileus 2004   post-op   . Kidney stone    stones    There are no active problems to display for this patient.   Past Surgical History:  Procedure Laterality Date  . ABDOMINAL HYSTERECTOMY    . COLON SURGERY    . FRACTURE SURGERY     R femur    Allergies Patient has no known allergies.  No family history on file.  Social History Social History   Tobacco Use  . Smoking status: Current Every Day Smoker    Packs/day: 0.50    Types: Cigarettes  . Smokeless tobacco: Never Used  Substance Use Topics  . Alcohol use: Yes    Comment: occasional  . Drug use: Yes    Types: Marijuana    Comment: occasionally    Review of Systems  Constitutional: No fever/chills Eyes: No visual changes. ENT: No sore throat. Cardiovascular: Denies chest pain. Respiratory: Denies shortness of breath. Gastrointestinal: No abdominal pain.  No nausea, no vomiting.  No diarrhea.  No constipation. Genitourinary: Negative for dysuria. Musculoskeletal:  Positive left leg burning pain.  Skin: Negative for rash. Neurological: Negative for headaches, focal weakness. Left side numbness.   10-point ROS otherwise negative.  ____________________________________________   PHYSICAL EXAM:  VITAL SIGNS: ED Triage Vitals  Enc Vitals Group     BP 05/12/19 1046 (!) 171/104     Pulse Rate 05/12/19 1046 (!) 111     Resp 05/12/19 1046 20     Temp 05/12/19 1046 98.5 F (36.9 C)     Temp src --      SpO2 05/12/19 1046 98 %     Weight 05/12/19 1041 143 lb 4.8 oz (65 kg)     Height 05/12/19 1041 5\' 6"  (1.676 m)     Pain Score 05/12/19 1041 10   Constitutional: Alert and oriented. Well appearing and in no acute distress. Eyes: Conjunctivae are normal.  Head: Atraumatic. Nose: No congestion/rhinnorhea. Mouth/Throat: Mucous membranes are moist. Neck: No stridor.   Cardiovascular: Normal rate, regular rhythm. Good peripheral circulation. Grossly normal heart sounds.  Patient with strong DP and PT pulses in the bilateral lower extremities.  Respiratory: Normal respiratory effort. Gastrointestinal: No distention.  Musculoskeletal: No lower extremity tenderness nor edema. No gross deformities of extremities. Neurologic:  Normal speech and language.  Subjective decreased sensation in the left upper and left lower extremities.  Normal motor strength bilaterally.  Skin:  Skin is warm, dry and intact. No rash noted.  ____________________________________________   PROCEDURES  Procedure(s) performed:   Procedures  None  ____________________________________________   INITIAL IMPRESSION / ASSESSMENT AND PLAN / ED COURSE  Pertinent labs & imaging results that were available during my care of the patient were reviewed by me and considered in my medical decision making (see chart for details).   Patient presents to the emergency department with burning pain in the left leg.  Symptoms began in the foot and have traveled upward to the leg.  Seems most  consistent with neuropathy although not bilateral.  She does have numbness on the left which was present during her last ED visit on 6/15.  I reviewed the imaging and lab testing from that visit.  Her current symptoms were present at that time although have worsened.  I do not see an indication to repeat any imaging or lab testing at this time. Plan to treat empirically for neuropathy by starting gabapentin but advised that I will be writing a short course of medicine and that she will have to follow with her PCP by phone to have this extended as the dose will likely need to be increased and titrated to symptom relief.  Patient does not take other opiates or benzodiazepines. Will give decadron IM here with some radiation into the buttock in consideration or possible sciatica but presentation is atypical for sciatica.    ____________________________________________  FINAL CLINICAL IMPRESSION(S) / ED DIAGNOSES  Final diagnoses:  Neuropathy     MEDICATIONS GIVEN DURING THIS VISIT:  Medications  dexamethasone (DECADRON) injection 10 mg (10 mg Intramuscular Given 05/12/19 1118)     NEW OUTPATIENT MEDICATIONS STARTED DURING THIS VISIT:  New Prescriptions   GABAPENTIN (NEURONTIN) 300 MG CAPSULE    Take 1 capsule (300 mg total) by mouth 2 (two) times daily for 5 days.    Note:  This document was prepared using Dragon voice recognition software and may include unintentional dictation errors.  Alona BeneJoshua Ellen Goris, MD Emergency Medicine    Styles Fambro, Arlyss RepressJoshua G, MD 05/12/19 1125

## 2019-05-12 NOTE — ED Triage Notes (Signed)
C/O BURNING SENSATION IN LEFT LEG FOR THE PAST 4 DAYS

## 2019-05-14 ENCOUNTER — Encounter (HOSPITAL_COMMUNITY): Payer: Self-pay | Admitting: Emergency Medicine

## 2019-05-14 ENCOUNTER — Other Ambulatory Visit: Payer: Self-pay

## 2019-05-14 ENCOUNTER — Emergency Department (HOSPITAL_COMMUNITY)
Admission: EM | Admit: 2019-05-14 | Discharge: 2019-05-14 | Disposition: A | Discharge status: Home / Self Care | Payer: Self-pay | Attending: Emergency Medicine | Admitting: Emergency Medicine

## 2019-05-14 DIAGNOSIS — G629 Polyneuropathy, unspecified: Secondary | ICD-10-CM | POA: Insufficient documentation

## 2019-05-14 MED ORDER — HYDROCODONE-ACETAMINOPHEN 5-325 MG PO TABS: 2.0000 | ORAL_TABLET | ORAL | 0 refills | Status: DC | PRN

## 2019-05-14 NOTE — ED Provider Notes (Signed)
[facility] EMERGENCY DEPARTMENT Provider Note   CSN: [id number] Arrival date & time: 05/14/19  1913     History   Chief Complaint Chief Complaint  Patient presents with  . Leg Pain    HPI Ashley Rojas is a 53 y.o. female.     Patient with persistent complaint of burning sensation glovelike throughout the whole left lower extremity.  All the way into the buttocks area.  Anteriorly and posteriorly and all the way to the tip of the toes.  Not in a dermatome distribution not in a sciatic nerve distribution.  Patient seen by me on June 15 had MR I MRA of the brain for presentation then.  Patient seen on June 19 for the persistent burning sensation which was a little different than when I saw her for on Monday.  She was treated with some Decadron then.  Patient states that it has made no difference.  Patient denies any weakness to the foot or leg.  Just has the global burning sensation.  No other symptoms.  Patient has follow-up with neurology scheduled for June 29.  Patient is on Neurontin as well.     Past Medical History:  Diagnosis Date  . History of ileus 2004   post-op   . Kidney stone    stones    There are no active problems to display for this patient.   Past Surgical History:  Procedure Laterality Date  . ABDOMINAL HYSTERECTOMY    . COLON SURGERY    . FRACTURE SURGERY     R femur     OB History   No obstetric history on file.      Home Medications    Prior to Admission medications   Medication Sig Start Date End Date Taking? Authorizing Provider  aspirin 81 MG chewable tablet Chew 162 mg by mouth 2 (two) times daily as needed for mild pain or moderate pain.     [provider]  Aspirin-Acetaminophen-Caffeine (GOODY HEADACHE PO) Take 1 packet by mouth daily as needed (pain).    [provider]  gabapentin (NEURONTIN) 300 MG capsule Take 1 capsule (300 mg total) by mouth 2 (two) times daily for 5 days. 05/12/19 05/17/19  Long, Arlyss RepressJoshua G, MD   methocarbamol (ROBAXIN) 500 MG tablet Take 2 tablets (1,000 mg total) by mouth 4 (four) times daily as needed for muscle spasms (muscle spasm/pain). Patient not taking: Reported on 05/08/2019 07/25/18   Samuel JesterMcManus, Kathleen, DO  naproxen (NAPROSYN) 250 MG tablet Take 1 tablet (250 mg total) by mouth 2 (two) times daily as needed for mild pain or moderate pain (take with food). Patient not taking: Reported on 05/08/2019 07/25/18   Samuel JesterMcManus, Kathleen, DO    Family History No family history on file.  Social History Social History   Tobacco Use  . Smoking status: Current Every Day Smoker    Packs/day: 0.50    Types: Cigarettes  . Smokeless tobacco: Never Used  Substance Use Topics  . Alcohol use: Yes    Comment: occasional  . Drug use: Yes    Types: Marijuana    Comment: occasionally     Allergies   Patient has no known allergies.   Review of Systems Review of Systems  Constitutional: Negative for chills and fever.  HENT: Negative for congestion, rhinorrhea and sore throat.   Eyes: Negative for visual disturbance.  Respiratory: Negative for cough and shortness of breath.   Cardiovascular: Negative for chest pain and leg swelling.  Gastrointestinal: Negative  for abdominal pain, diarrhea, nausea and vomiting.  Genitourinary: Negative for dysuria.  Musculoskeletal: Negative for back pain and neck pain.  Skin: Negative for rash.  Neurological: Positive for numbness. Negative for dizziness, weakness, light-headedness and headaches.  Hematological: Does not bruise/bleed easily.  Psychiatric/Behavioral: Negative for confusion.     Physical Exam Updated Vital Signs BP (!) 164/94   Pulse (!) 106   Temp 98.1 F (36.7 C)   Resp 18   Ht 1.626 m (5\' 4" )   Wt 65 kg   SpO2 96%   BMI 24.60 kg/m   Physical Exam Vitals signs and nursing note reviewed.  Constitutional:      General: She is not in acute distress.    Appearance: Normal appearance. She is well-developed. She is not  ill-appearing.  HENT:     Head: Normocephalic and atraumatic.  Eyes:     Extraocular Movements: Extraocular movements intact.     Conjunctiva/sclera: Conjunctivae normal.     Pupils: Pupils are equal, round, and reactive to light.  Neck:     Musculoskeletal: Neck supple.  Cardiovascular:     Rate and Rhythm: Normal rate and regular rhythm.     Heart sounds: No murmur.  Pulmonary:     Effort: Pulmonary effort is normal. No respiratory distress.     Breath sounds: Normal breath sounds.  Abdominal:     Palpations: Abdomen is soft.     Tenderness: There is no abdominal tenderness.  Musculoskeletal: Normal range of motion.        General: No swelling.     Comments: Left lower extremity dorsalis pedis pulses 2+.  No rash.  No swelling.  Good range of motion at the toes foot ankle knee hip.  Skin:    General: Skin is warm and dry.     Capillary Refill: Capillary refill takes less than 2 seconds.  Neurological:     Mental Status: She is alert and oriented to person, place, and time.     Sensory: Sensory deficit present.     Motor: No weakness.     Comments: Not really numbness just a burning sensation in the left lower extremity.  No weakness.  Dorsalis pedis pulses 2+.  Good movement at toes ankle knee hip area.      ED Treatments / Results  Labs (all labs ordered are listed, but only abnormal results are displayed) Labs Reviewed - No data to display  EKG    Radiology No results found.  Procedures Procedures (including critical care time)  Medications Ordered in ED Medications - No data to display   Initial Impression / Assessment and Plan / ED Course  I have reviewed the triage vital signs and the nursing notes.  Pertinent labs & imaging results that were available during my care of the patient were reviewed by me and considered in my medical decision making (see chart for details).       Not sure what to make about the burning sensation  the pattern of it in the  left lower extremity.  Does not fit sciatica.  MRI brain did not show anything consistent with multiple sclerosis.  No real back injury no real distinct back pain.  The burning sensation is global.  Patient is on Neurontin patient's been given steroids no improvement.  Does have follow-up with neurology on 29th.  We will give patient short course of hydrocodone to help her sleep at night.  Patient without any weakness no decreased sensation no dermatome pattern.  No rash does not fit shingles pattern.  No vascular compromise good blood flow to the foot.   Final Clinical Impressions(s) / ED Diagnoses   Final diagnoses:  None    ED Discharge Orders    None       Vanetta MuldersZackowski, Syon Tews, MD 05/14/19 2039

## 2019-05-14 NOTE — ED Notes (Signed)
Patient walking around door into hallway. Pt instructed to remain in room to avoid violating other patient's privacy. Told pt the provider would be in as soon as they could.

## 2019-05-14 NOTE — ED Triage Notes (Signed)
Pt c/o burning sensation in left leg that started on Monday. Has been seen 3 x in the last week for same. Pt ambulated into triage.

## 2019-05-14 NOTE — Discharge Instructions (Signed)
Keep the appointment with neurology scheduled for June 29.  Going take the hydrocodone as needed for that burning sensation this may help you sleep some at night.

## 2019-05-15 MED FILL — Hydrocodone-Acetaminophen Tab 5-325 MG: ORAL | Qty: 6 | Status: AC

## 2020-10-15 IMAGING — MR MRI HEAD WITHOUT CONTRAST
8 of 10 series · 34 of 48 positions shown · non-contrast
Comparison: CT head 05/08/2019

CLINICAL DATA: LEFT arm numbness and headache for 2 days.

EXAM:
MRI HEAD WITHOUT CONTRAST
MRA HEAD WITHOUT CONTRAST
TECHNIQUE: Multiplanar, multiecho pulse sequences of the brain and surrounding
structures were obtained without intravenous contrast. Angiographic
images of the head were obtained using MRA technique without
contrast.

[Series 2: T1 · sagittal · 5.0mm · 0.41mm/px · 2 of 20 slices shown (1 of 2)]
[im 1/20]
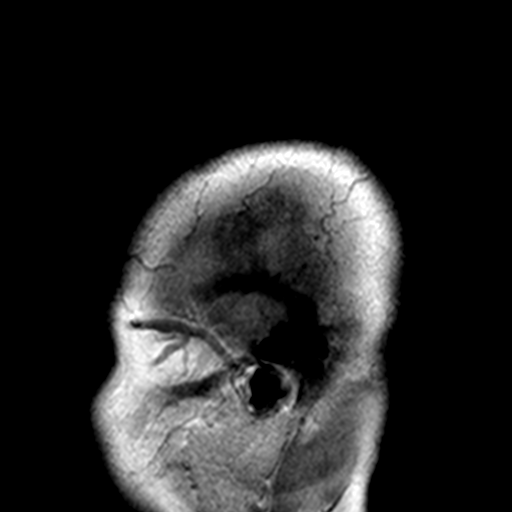
[im 20/20]
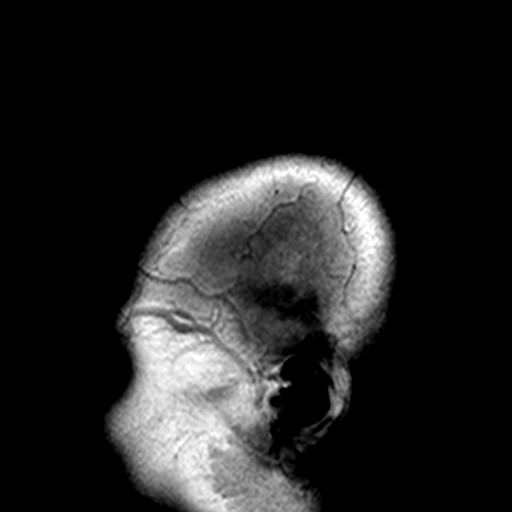

[Series 3: DWI · axial · 3.0mm · 0.82mm/px · z∈[-120,+22]mm · 6 of 50 slices shown (1 of 2)]
[im 1/50]
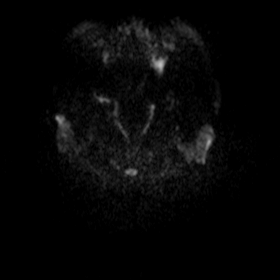
[im 10/50]
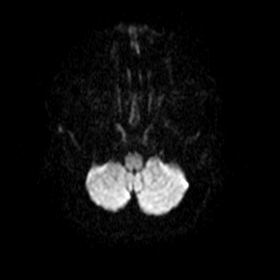
[im 20/50]
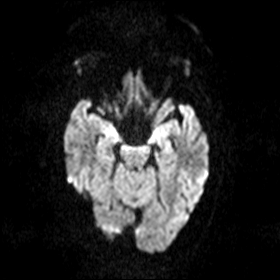
[im 30/50]
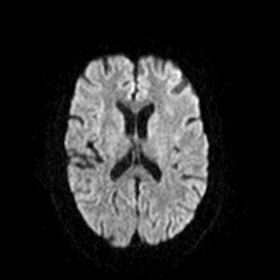
[im 40/50]
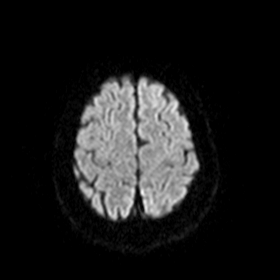
[im 50/50]
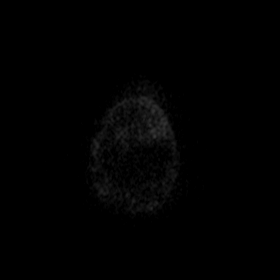

[Series 5: DWI · coronal · 5.0mm · 0.45mm/px · 4 of 34 slices shown (2 of 2)]
[im 1/34]
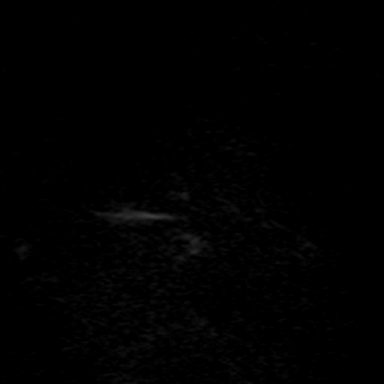
[im 12/34]
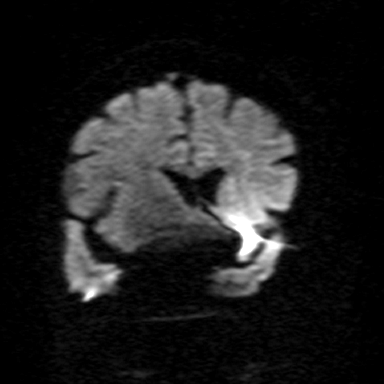
[im 23/34]
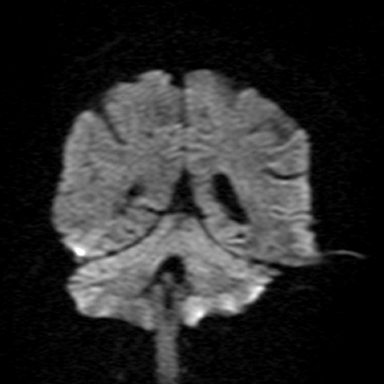
[im 34/34]
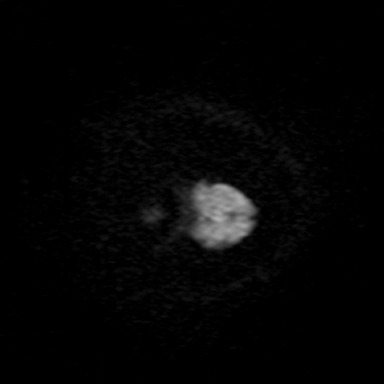

[Series 7: T2 · axial · 5.0mm · 0.75mm/px · z∈[-120,+19]mm · 3 of 23 slices shown (1 of 3)]
[im 1/23]
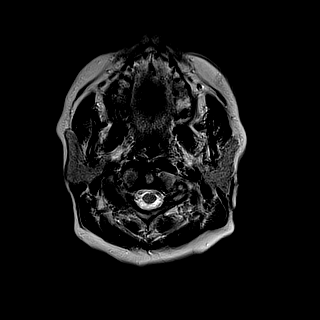
[im 12/23]
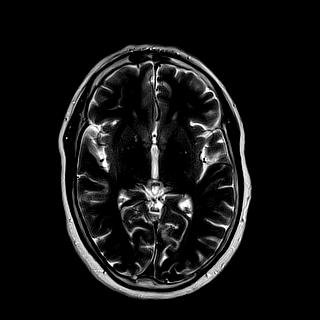
[im 23/23]
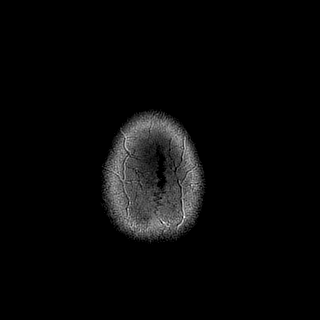

[Series 8: T2 · axial · 5.0mm · 0.45mm/px · z∈[-112,+14]mm · 3 of 21 slices shown (2 of 3)]
[im 1/21]
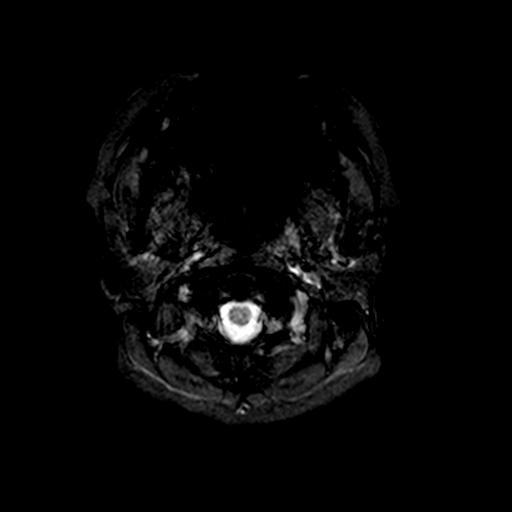
[im 11/21]
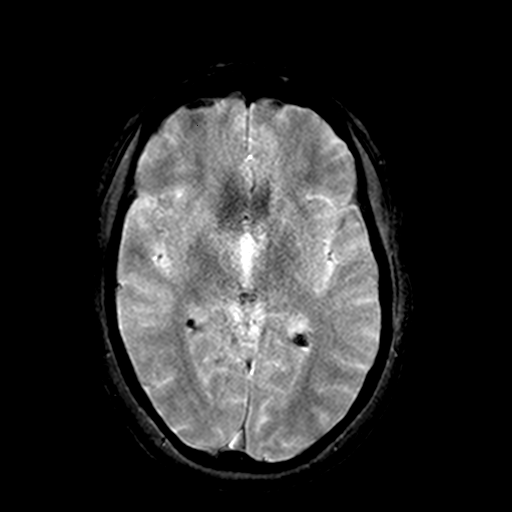
[im 21/21]
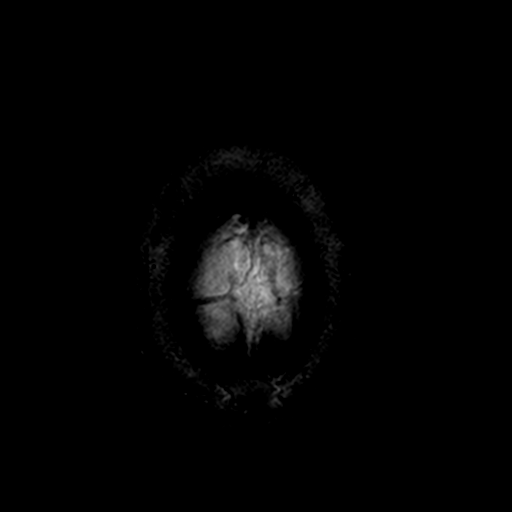

[Series 9: FLAIR · axial · 3.0mm · 0.94mm/px · z∈[-117,+17]mm · 6 of 47 slices shown]
[im 1/47]
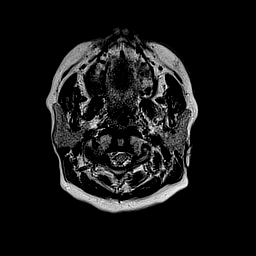
[im 10/47]
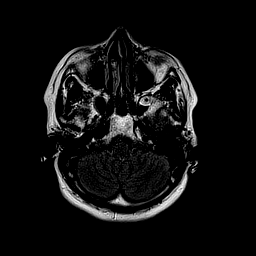
[im 19/47]
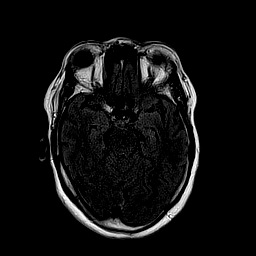
[im 28/47]
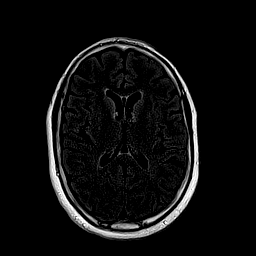
[im 37/47]
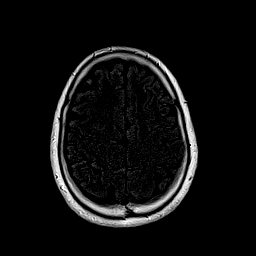
[im 47/47]
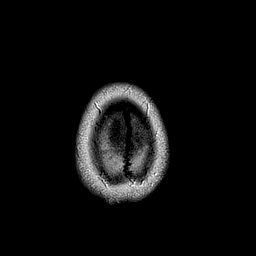

[Series 10: T1 · axial · 2.0mm · 0.47mm/px · z∈[-132,+14]mm · 7 of 95 slices shown (2 of 2)]
[im 1/95]
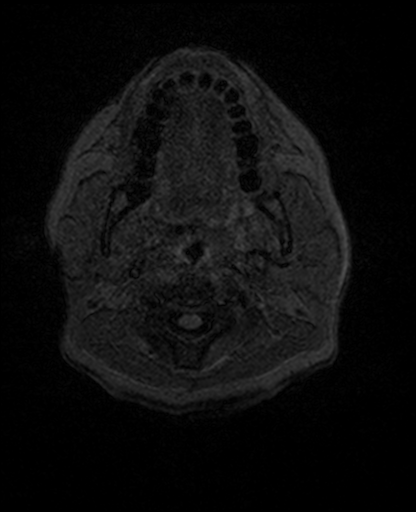
[im 19/95]
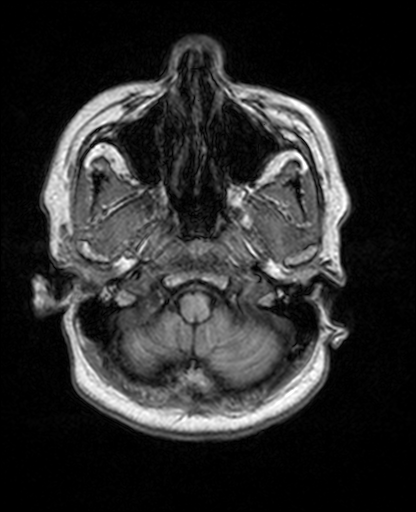
[im 29/95]
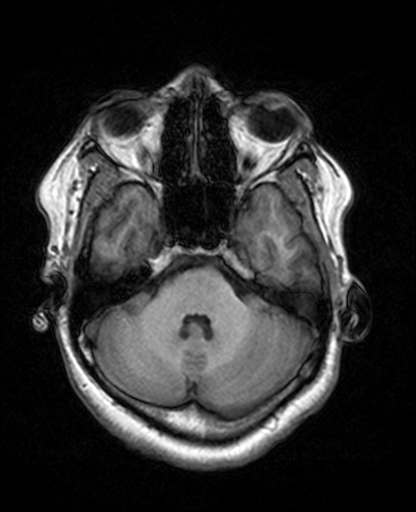
[im 38/95]
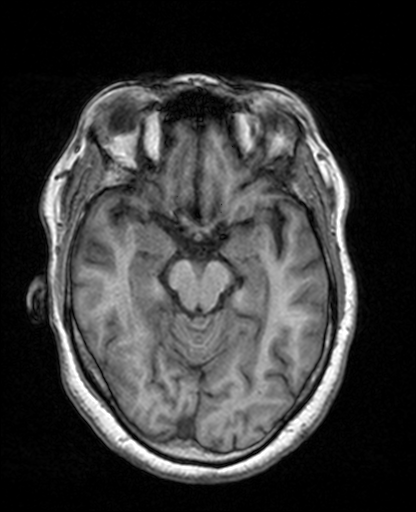
[im 57/95]
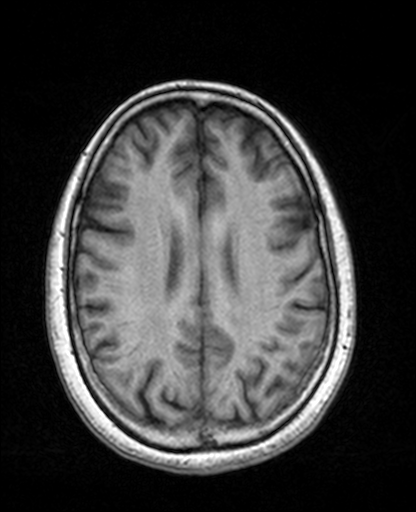
[im 66/95]
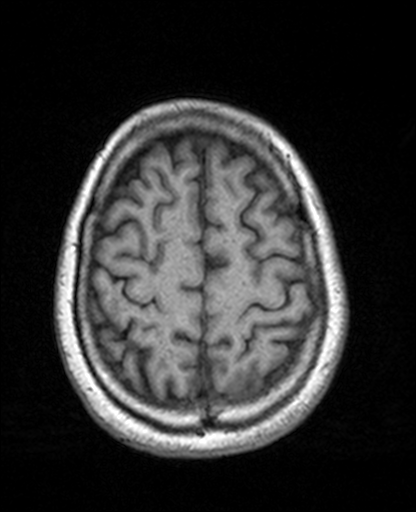
[im 76/95]
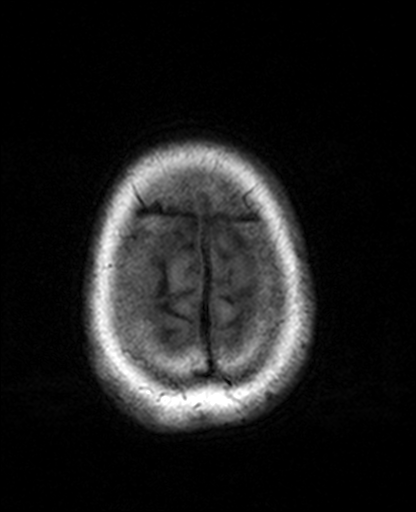

[Series 11: T2 · coronal · 5.0mm · 0.58mm/px · 3 of 28 slices shown (3 of 3)]
[im 1/28]
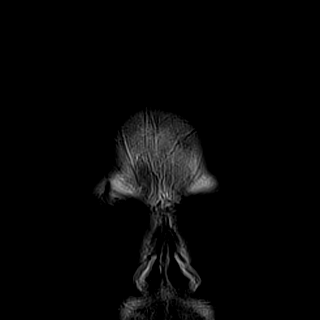
[im 14/28]
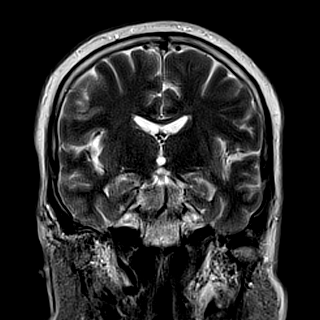
[im 28/28]
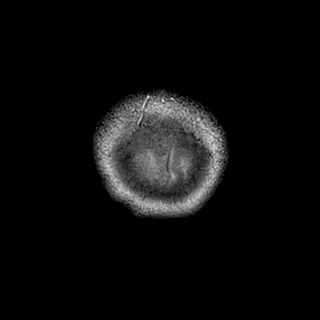

[34 of 48 positions shown; findings below may reference images not displayed]

FINDINGS: MRI HEAD FINDINGS

Brain: No evidence for acute infarction, hemorrhage, mass lesion,
hydrocephalus, or extra-axial fluid. Borderline atrophy. No
significant white matter disease.

Vascular: Flow voids are maintained.

Skull and upper cervical spine: Normal marrow signal.

Sinuses/Orbits: Negative.

Other: None.

MRA HEAD FINDINGS

The internal carotid arteries are widely patent. The basilar artery
is widely patent with vertebrals codominant. There is no
intracranial stenosis or saccular aneurysm. A small infundibulum
projects from the LEFT posterior communicating/fetal LEFT PCA
origin.
IMPRESSION: MRI brain demonstrates no acute intracranial abnormality, or
significant white matter disease. No intracranial or extracranial
cause for headaches is observed.

Specifically, there is no acute stroke causing LEFT arm numbness.

MRA intracranial circulation demonstrates no significant stenosis or
vascular abnormality.

## 2021-05-23 ENCOUNTER — Encounter: Payer: Self-pay | Admitting: Cardiovascular Disease

## 2021-06-12 NOTE — Progress Notes (Signed)
CARDIOLOGY CONSULT NOTE       Patient ID: Ashley Rojas MRN: [id number] DOB/AGE: [birth year] 55 y.o.  Admit date: (Not on file) Referring Physician: [place] Primary Physician: Patient, No Pcp Per (Inactive) Primary Cardiologist: New Reason for Consultation: Chest pain  Active Problems:   * No active hospital problems. *   HPI:  55 y.o. referred by Dr Gerilyn Pilgrim for atypical chest pain. No other records or history provided by primary  History of post op ileus and kidney stones hysterectomy and right femur surgery   She is a 30 pack year smoker Pain is very atypical sharp fleeting only the last few weeks. ? Mild pleurtic Component She has not has recent CXR/CT as she has no primary Indicates seeing [place] since having a stroke   She is one of 5 siblings Her younger sister was with her and sees Dr Herbie Baltimore for CAD/MI  ROS All other systems reviewed and negative except as noted above  Past Medical History:  Diagnosis Date   History of ileus 2004   post-op    Kidney stone    stones    No family history on file.  Social History   Socioeconomic History   Marital status: Single    Spouse name: Not on file   Number of children: Not on file   Years of education: Not on file   Highest education level: Not on file  Occupational History   Not on file  Tobacco Use   Smoking status: Every Day    Packs/day: 0.50    Types: Cigarettes   Smokeless tobacco: Never  Substance and Sexual Activity   Alcohol use: Yes    Comment: occasional   Drug use: Yes    Types: Marijuana    Comment: occasionally   Sexual activity: Never    Birth control/protection: None  Other Topics Concern   Not on file  Social History Narrative   Not on file   Social Determinants of Health   Financial Resource Strain: Not on file  Food Insecurity: Not on file  Transportation Needs: Not on file  Physical Activity: Not on file  Stress: Not on file  Social Connections: Not on file  Intimate  Partner Violence: Not on file    Past Surgical History:  Procedure Laterality Date   ABDOMINAL HYSTERECTOMY     COLON SURGERY     FRACTURE SURGERY     R femur      Current Outpatient Medications:    aspirin 81 MG chewable tablet, Chew 162 mg by mouth 2 (two) times daily as needed for mild pain or moderate pain. , Disp: , Rfl:    Aspirin-Acetaminophen-Caffeine (GOODY HEADACHE PO), Take 1 packet by mouth daily as needed (pain)., Disp: , Rfl:    gabapentin (NEURONTIN) 300 MG capsule, Take 1 capsule (300 mg total) by mouth 2 (two) times daily for 5 days., Disp: 10 capsule, Rfl: 0   HYDROcodone-acetaminophen (NORCO/VICODIN) 5-325 MG tablet, Take 2 tablets by mouth every 4 (four) hours as needed., Disp: 6 tablet, Rfl: 0   methocarbamol (ROBAXIN) 500 MG tablet, Take 2 tablets (1,000 mg total) by mouth 4 (four) times daily as needed for muscle spasms (muscle spasm/pain). (Patient not taking: Reported on 05/08/2019), Disp: 25 tablet, Rfl: 0   naproxen (NAPROSYN) 250 MG tablet, Take 1 tablet (250 mg total) by mouth 2 (two) times daily as needed for mild pain or moderate pain (take with food). (Patient not taking: Reported on 05/08/2019), Disp: 14 tablet, Rfl: 0  Physical Exam: There were no vitals taken for this visit.    Affect appropriate Healthy:  appears stated age HEENT: bronchitic voice/cough  Neck supple with no adenopathy JVP normal no bruits no thyromegaly Lungs clear with no wheezing and good diaphragmatic motion Heart:  S1/S2 no murmur, no rub, gallop or click PMI normal Abdomen: benighn, BS positve, no tenderness, no AAA no bruit.  No HSM or HJR Distal pulses intact with no bruits No edema Neuro non-focal Skin warm and dry No muscular weakness   Labs:   Lab Results  Component Value Date   WBC 7.1 05/08/2019   HGB 12.3 05/08/2019   HCT 38.6 05/08/2019   MCV 86.2 05/08/2019   PLT 221 05/08/2019   No results for input(s): NA, K, CL, CO2, BUN, CREATININE, CALCIUM,  PROT, BILITOT, ALKPHOS, ALT, AST, GLUCOSE in the last 168 hours.  Invalid input(s): LABALBU Lab Results  Component Value Date   TROPONINI <0.03 07/13/2017   No results found for: CHOL No results found for: HDL No results found for: LDLCALC No results found for: TRIG No results found for: CHOLHDL No results found for: LDLDIRECT    Radiology: No results found.  EKG: SR rate 72 normal 05/09/19    ASSESSMENT AND PLAN:   Chest pain:  atypical normal ECG Shared decision making will order ETT Smoking: bronchitic voice f/u lung cancer screening CT HTN:  continue norvasc good control low sodium diet    ETT  Lung cancer CT F/U PRN   Signed: Charlton Haws 06/12/2021, 4:31 PM

## 2021-06-18 ENCOUNTER — Ambulatory Visit (INDEPENDENT_AMBULATORY_CARE_PROVIDER_SITE_OTHER): Payer: Self-pay | Admitting: Cardiovascular Disease

## 2021-06-18 ENCOUNTER — Encounter: Payer: Self-pay | Admitting: Cardiovascular Disease

## 2021-06-18 ENCOUNTER — Other Ambulatory Visit: Payer: Self-pay

## 2021-06-18 VITALS — BP 126/82 | HR 70 | Ht 67.0 in | Wt 145.2 lb

## 2021-06-18 DIAGNOSIS — F17200 Nicotine dependence, unspecified, uncomplicated: Secondary | ICD-10-CM

## 2021-06-18 DIAGNOSIS — I10 Essential (primary) hypertension: Secondary | ICD-10-CM

## 2021-06-18 DIAGNOSIS — R079 Chest pain, unspecified: Secondary | ICD-10-CM

## 2021-06-18 NOTE — Patient Instructions (Signed)
Medication Instructions:  Your physician recommends that you continue on your current medications as directed. Please refer to the Current Medication list given to you today.  *If you need a refill on your cardiac medications before your next appointment, please call your pharmacy*   Lab Work: NONE   If you have labs (blood work) drawn today and your tests are completely normal, you will receive your results only by: MyChart Message (if you have MyChart) OR A paper copy in the mail If you have any lab test that is abnormal or we need to change your treatment, we will call you to review the results.   Testing/Procedures: Your physician has requested that you have an exercise tolerance test. For further information please visit [email or web address] Please also follow instruction sheet, as given.  Lung Cancer screening CT    Follow-Up: At [facility], you and your health needs are our priority.  As part of our continuing mission to provide you with exceptional heart care, we have created designated Provider Care Teams.  These Care Teams include your primary Cardiologist (physician) and Advanced Practice Providers (APPs -  Physician Assistants and Nurse Practitioners) who all work together to provide you with the care you need, when you need it.  We recommend signing up for the patient portal called "MyChart".  Sign up information is provided on this After Visit Summary.  MyChart is used to connect with patients for Virtual Visits (Telemedicine).  Patients are able to view lab/test results, encounter notes, upcoming appointments, etc.  Non-urgent messages can be sent to your provider as well.   To learn more about what you can do with MyChart, go to [email or web address].    Your next appointment:    As Needed   The format for your next appointment:   In Person  Provider:   Charlton Haws, MD   Other Instructions Thank you for choosing  [facility]!

## 2021-06-19 NOTE — Addendum Note (Signed)
Addended by: Kerney Elbe on: 06/19/2021 10:34 AM   Modules accepted: Orders

## 2021-07-01 ENCOUNTER — Ambulatory Visit (HOSPITAL_COMMUNITY)
Admission: RE | Admit: 2021-07-01 | Discharge: 2021-07-01 | Disposition: A | Discharge status: Home / Self Care | Payer: Self-pay | Source: Ambulatory Visit | Attending: Cardiovascular Disease | Admitting: Cardiovascular Disease

## 2021-07-01 ENCOUNTER — Telehealth: Payer: Self-pay

## 2021-07-01 ENCOUNTER — Other Ambulatory Visit: Payer: Self-pay

## 2021-07-01 DIAGNOSIS — R079 Chest pain, unspecified: Secondary | ICD-10-CM | POA: Insufficient documentation

## 2021-07-01 LAB — EXERCISE TOLERANCE TEST
Estimated workload: 7 METS
Exercise duration (min): 5 min
Exercise duration (sec): 32 s
MPHR: 164 {beats}/min
Peak HR: 133 {beats}/min
Percent HR: 81 %
RPE: 15
Rest HR: 61 {beats}/min

## 2021-07-01 NOTE — Telephone Encounter (Signed)
Pt notified and verbalized understanding. Pt had no questions or concerns at the time.

## 2021-07-01 NOTE — Telephone Encounter (Signed)
-----   Message from Wendall Stade, MD sent at 07/01/2021  4:03 PM EDT ----- Normal ETT

## 2021-07-18 ENCOUNTER — Other Ambulatory Visit: Payer: Self-pay

## 2021-07-18 ENCOUNTER — Ambulatory Visit (HOSPITAL_COMMUNITY): Admission: RE | Admit: 2021-07-18 | Payer: 59 | Source: Ambulatory Visit

## 2021-08-18 ENCOUNTER — Ambulatory Visit (HOSPITAL_COMMUNITY): Admission: RE | Admit: 2021-08-18 | Payer: 59 | Source: Ambulatory Visit

## 2022-08-19 DIAGNOSIS — G890 Central pain syndrome: Secondary | ICD-10-CM | POA: Diagnosis not present

## 2022-08-19 DIAGNOSIS — G466 Pure sensory lacunar syndrome: Secondary | ICD-10-CM | POA: Diagnosis not present

## 2022-08-19 DIAGNOSIS — I10 Essential (primary) hypertension: Secondary | ICD-10-CM | POA: Diagnosis not present

## 2022-08-19 DIAGNOSIS — R071 Chest pain on breathing: Secondary | ICD-10-CM | POA: Diagnosis not present

## 2022-08-20 ENCOUNTER — Encounter: Payer: Self-pay | Admitting: Neurology

## 2023-01-05 NOTE — Progress Notes (Unsigned)
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 For left sided muscle spasms, baclofen 1013mID PRN Refer to physical therapy for neck pain Secondary stroke prevention as managed by PCP: ASA 325m8mily LDL goal less than 70.  You may need to be on a statin.  Recommend discussing with your PCP. Normotensive blood pressure Hgb A1c goal less than 7 Smoking cessation Follow up 5 months.    Subjective:  Ashley Rojas 57 y45r old female with hypertension, cigarette smoker and history of MRI-negative right thalamic stroke with chronic thalamic pain syndrome presents for headaches.  History supplemented by ED and referring provider's note.   MRI and MRA of head personally reviewed.   Right Thalamic Pain Syndrome: She had a presumed DWI-negative right thalamic stroke in June 2020.  She presented to the ED with acute onset of left sided upper and lower extremity numbness.  No weakness, speech disturbance, facial droop.  MRI of brain was negative for acute intracranial abnormality or significant white matter disease.  MRA of head showed no LVO, significant stenosis or aneurysm.  Within a couple of days she developed burning pain involving the left leg but not in a dermatomal distribution.  Sometimes she experiences painful spasms on the left side of her torso.  No pain in the arm but sometimes it "jumps". More recently, she has been experiencing left sided neck pain.  If she turns her head to the left, she  feels a shooting pain up the neck and back of the head.  Sometimes her left arm will "jerk".   Headaches: Longstanding history of headaches that became worse after stroke.  Either side, sharp.  Sometimes nausea, no photophobia or phonophobia.  Typically lasts 45 minutes with Tylenol.  They occur twice a week.    Past NSAIDS/analgesics:  hydrocodone Past abortive triptans:  CONTRAINDICATED Past abortive ergotamine:  none Past muscle relaxants:  Robaxin Past anti-emetic:  none Past antihypertensive medications:  none Past antidepressant medications:  none Past anticonvulsant medications:  none Past anti-CGRP:  none Past vitamins/Herbal/Supplements:  none Past antihistamines/decongestants:  none Other past therapies:  none  Current NSAIDS/analgesics:  ASA 325mg26mly Current triptans:  CONTRAINDICATED Current ergotamine:  none Current anti-emetic:  none Current muscle relaxants:  none Current Antihypertensive medications:  atenolol 50mg 60m amlodipine 10mg Q34mrrent Antidepressant medications:  duloxetine 90mg da27m(chronic pain) Current Anticonvulsant medications:  gabapentin 600mg Q6h69mrent anti-CGRP:  none Current Vitamins/Herbal/Supplements:  none Current Antihistamines/Decongestants:  none        PAST MEDICAL HISTORY: Past Medical History:  Diagnosis Date   History of ileus 2004   post-op    Kidney stone    stones    PAST SURGICAL HISTORY: Past Surgical History:  Procedure Laterality Date   ABDOMINAL HYSTERECTOMY     COLON SURGERY     FRACTURE SURGERY     R femur    MEDICATIONS: Current Outpatient Medications on File Prior to Visit  Medication Sig Dispense Refill   amLODipine (NORVASC) 10 MG tablet Take 10 mg by mouth daily.     aspirin 81 MG chewable  tablet Chew 162 mg by mouth 2 (two) times daily as needed for mild pain or moderate pain.      Aspirin-Acetaminophen-Caffeine (GOODY HEADACHE PO) Take 1 packet by mouth daily as needed (pain).      atenolol (TENORMIN) 50 MG tablet Take 50 mg by mouth daily.     DULoxetine (CYMBALTA) 30 MG capsule Take 30 mg by mouth daily.     gabapentin (NEURONTIN) 300 MG capsule Take 1 capsule (300 mg total) by mouth 2 (two) times daily for 5 days. (Patient not taking: Reported on 06/18/2021) 10 capsule 0   gabapentin (NEURONTIN) 600 MG tablet Take 600 mg by mouth 4 (four) times daily.     HYDROcodone-acetaminophen (NORCO/VICODIN) 5-325 MG tablet Take 2 tablets by mouth every 4 (four) hours as needed. (Patient not taking: Reported on 06/18/2021) 6 tablet 0   methocarbamol (ROBAXIN) 500 MG tablet Take 2 tablets (1,000 mg total) by mouth 4 (four) times daily as needed for muscle spasms (muscle spasm/pain). (Patient not taking: No sig reported) 25 tablet 0   naproxen (NAPROSYN) 250 MG tablet Take 1 tablet (250 mg total) by mouth 2 (two) times daily as needed for mild pain or moderate pain (take with food). 14 tablet 0   zolpidem (AMBIEN) 10 MG tablet Take 10 mg by mouth daily.     No current facility-administered medications on file prior to visit.    ALLERGIES: No Known Allergies  FAMILY HISTORY: No family history on file.  Objective:  Blood pressure 102/60, pulse 72, height 5' 5"$  (1.651 m), weight 145 lb (65.8 kg), SpO2 95 %. General: No acute distress.  Patient appears well-groomed.   Head:  Normocephalic/atraumatic Eyes:  fundi examined but not visualized Neck: supple, no paraspinal tenderness, full range of motion Back: No paraspinal tenderness Heart: regular rate and rhythm Lungs: Clear to auscultation bilaterally. Vascular: No carotid bruits. Neurological Exam: Mental status: alert and oriented to person, place, and time, speech fluent and not dysarthric, language intact. Cranial nerves: CN I: not tested CN II: pupils equal, round and reactive to light, visual fields intact CN III, IV, VI:  full range of motion, no nystagmus, no ptosis CN V: facial sensation intact. CN VII: upper and  lower face symmetric CN VIII: hearing intact CN IX, X: gag intact, uvula midline CN XI: sternocleidomastoid and trapezius muscles intact CN XII: tongue midline Bulk & Tone: normal, no fasciculations. Motor:  muscle strength 5/5 throughout Sensation:  Pinprick, temperature and vibratory sensation intact. Deep Tendon Reflexes:  2+ throughout,  toes downgoing.   Finger to nose testing:  Without dysmetria.   Heel to shin:  Without dysmetria.   Gait:  Normal station and stride.  Romberg negative.    Thank you for allowing me to take part in the care of this patient.  Metta Clines, DO  CC: Tesfaye Janell Quiet, MD

## 2023-01-06 ENCOUNTER — Encounter: Payer: Self-pay | Admitting: Neurology

## 2023-01-06 ENCOUNTER — Ambulatory Visit: Payer: 59 | Admitting: Neurology

## 2023-01-06 VITALS — BP 102/60 | HR 72 | Ht 65.0 in | Wt 145.0 lb

## 2023-01-06 DIAGNOSIS — I10 Essential (primary) hypertension: Secondary | ICD-10-CM

## 2023-01-06 DIAGNOSIS — M542 Cervicalgia: Secondary | ICD-10-CM

## 2023-01-06 DIAGNOSIS — G890 Central pain syndrome: Secondary | ICD-10-CM

## 2023-01-06 DIAGNOSIS — F17200 Nicotine dependence, unspecified, uncomplicated: Secondary | ICD-10-CM | POA: Diagnosis not present

## 2023-01-06 DIAGNOSIS — G43109 Migraine with aura, not intractable, without status migrainosus: Secondary | ICD-10-CM

## 2023-01-06 DIAGNOSIS — R69 Illness, unspecified: Secondary | ICD-10-CM | POA: Diagnosis not present

## 2023-01-06 MED ORDER — BACLOFEN 10 MG PO TABS: 10.0000 mg | ORAL_TABLET | Freq: Three times a day (TID) | ORAL | 5 refills | Status: DC | PRN

## 2023-01-06 MED ORDER — GABAPENTIN 600 MG PO TABS: 600.0000 mg | ORAL_TABLET | Freq: Four times a day (QID) | ORAL | 5 refills | Status: DC

## 2023-01-06 NOTE — Patient Instructions (Signed)
Continue duloxetine 76m daily and gabapentin 6069mevery 6 hours For muscle spasms, take baclofen 107mp to three times daily AS NEEDED.  Caution for drowsiness Refer to physical therapy for neck pain Continue aspirin 325m74mily Discuss with your primary care provider about starting a cholesterol medication. Follow up 5 months.

## 2023-01-13 DIAGNOSIS — Z8673 Personal history of transient ischemic attack (TIA), and cerebral infarction without residual deficits: Secondary | ICD-10-CM | POA: Diagnosis not present

## 2023-01-13 DIAGNOSIS — I10 Essential (primary) hypertension: Secondary | ICD-10-CM | POA: Diagnosis not present

## 2023-01-13 DIAGNOSIS — G63 Polyneuropathy in diseases classified elsewhere: Secondary | ICD-10-CM | POA: Diagnosis not present

## 2023-01-13 DIAGNOSIS — R69 Illness, unspecified: Secondary | ICD-10-CM | POA: Diagnosis not present

## 2023-01-15 ENCOUNTER — Other Ambulatory Visit (HOSPITAL_COMMUNITY)
Admission: RE | Admit: 2023-01-15 | Discharge: 2023-01-15 | Disposition: A | Discharge status: Home / Self Care | Payer: 59 | Source: Ambulatory Visit | Attending: Internal Medicine | Admitting: Internal Medicine

## 2023-01-15 ENCOUNTER — Other Ambulatory Visit (HOSPITAL_COMMUNITY): Payer: Self-pay | Admitting: Internal Medicine

## 2023-01-15 DIAGNOSIS — I10 Essential (primary) hypertension: Secondary | ICD-10-CM | POA: Diagnosis not present

## 2023-01-15 DIAGNOSIS — E559 Vitamin D deficiency, unspecified: Secondary | ICD-10-CM | POA: Insufficient documentation

## 2023-01-15 DIAGNOSIS — Z0001 Encounter for general adult medical examination with abnormal findings: Secondary | ICD-10-CM | POA: Diagnosis not present

## 2023-01-15 DIAGNOSIS — Z1231 Encounter for screening mammogram for malignant neoplasm of breast: Secondary | ICD-10-CM

## 2023-01-15 DIAGNOSIS — E039 Hypothyroidism, unspecified: Secondary | ICD-10-CM | POA: Diagnosis not present

## 2023-01-15 DIAGNOSIS — Z79899 Other long term (current) drug therapy: Secondary | ICD-10-CM | POA: Insufficient documentation

## 2023-01-15 LAB — CBC WITH DIFFERENTIAL/PLATELET
Abs Immature Granulocytes: 0.03 10*3/uL (ref 0.00–0.07)
Basophils Absolute: 0 10*3/uL (ref 0.0–0.1)
Basophils Relative: 1 %
Eosinophils Absolute: 0.1 10*3/uL (ref 0.0–0.5)
Eosinophils Relative: 1 %
HCT: 45.8 % (ref 36.0–46.0)
Hemoglobin: 15.1 g/dL — ABNORMAL HIGH (ref 12.0–15.0)
Immature Granulocytes: 0 %
Lymphocytes Relative: 39 %
Lymphs Abs: 3.1 10*3/uL (ref 0.7–4.0)
MCH: 27.8 pg (ref 26.0–34.0)
MCHC: 33 g/dL (ref 30.0–36.0)
MCV: 84.2 fL (ref 80.0–100.0)
Monocytes Absolute: 0.6 10*3/uL (ref 0.1–1.0)
Monocytes Relative: 8 %
Neutro Abs: 4.1 10*3/uL (ref 1.7–7.7)
Neutrophils Relative %: 51 %
Platelets: 296 10*3/uL (ref 150–400)
RBC: 5.44 MIL/uL — ABNORMAL HIGH (ref 3.87–5.11)
RDW: 19 % — ABNORMAL HIGH (ref 11.5–15.5)
WBC: 7.9 10*3/uL (ref 4.0–10.5)
nRBC: 0 % (ref 0.0–0.2)

## 2023-01-15 LAB — BASIC METABOLIC PANEL
Anion gap: 7 (ref 5–15)
BUN: 8 mg/dL (ref 6–20)
CO2: 25 mmol/L (ref 22–32)
Calcium: 9.1 mg/dL (ref 8.9–10.3)
Chloride: 100 mmol/L (ref 98–111)
Creatinine, Ser: 0.64 mg/dL (ref 0.44–1.00)
GFR, Estimated: >60 mL/min (ref >60)
Glucose, Bld: 105 mg/dL — ABNORMAL HIGH (ref 70–99)
Potassium: 4 mmol/L (ref 3.5–5.1)
Sodium: 132 mmol/L — ABNORMAL LOW (ref 135–145)

## 2023-01-15 LAB — HEPATIC FUNCTION PANEL
ALT: 12 U/L (ref 0–44)
AST: 19 U/L (ref 15–41)
Albumin: 3.8 g/dL (ref 3.5–5.0)
Alkaline Phosphatase: 88 U/L (ref 38–126)
Bilirubin, Direct: <0.1 mg/dL (ref 0.0–0.2)
Total Bilirubin: 0.6 mg/dL (ref 0.3–1.2)
Total Protein: 7.6 g/dL (ref 6.5–8.1)

## 2023-01-15 LAB — TSH: TSH: 2.251 u[IU]/mL (ref 0.350–4.500)

## 2023-01-15 LAB — T4, FREE: Free T4: 0.75 ng/dL (ref 0.61–1.12)

## 2023-01-15 LAB — VITAMIN D 25 HYDROXY (VIT D DEFICIENCY, FRACTURES): Vit D, 25-Hydroxy: 31.41 ng/mL (ref 30–100)

## 2023-01-15 LAB — LIPID PANEL
Cholesterol: 258 mg/dL — ABNORMAL HIGH (ref 0–200)
HDL: 61 mg/dL (ref >40)
LDL Cholesterol: 156 mg/dL — ABNORMAL HIGH (ref 0–99)
Total CHOL/HDL Ratio: 4.2 RATIO
Triglycerides: 203 mg/dL — ABNORMAL HIGH (ref <150)
VLDL: 41 mg/dL — ABNORMAL HIGH (ref 0–40)

## 2023-01-18 ENCOUNTER — Ambulatory Visit (HOSPITAL_COMMUNITY)
Admission: RE | Admit: 2023-01-18 | Discharge: 2023-01-18 | Disposition: A | Discharge status: Home / Self Care | Payer: 59 | Source: Ambulatory Visit | Attending: Gerontology | Admitting: Gerontology

## 2023-01-18 ENCOUNTER — Other Ambulatory Visit (HOSPITAL_COMMUNITY): Payer: Self-pay | Admitting: Gerontology

## 2023-01-18 DIAGNOSIS — R059 Cough, unspecified: Secondary | ICD-10-CM | POA: Diagnosis not present

## 2023-01-25 DIAGNOSIS — Z0001 Encounter for general adult medical examination with abnormal findings: Secondary | ICD-10-CM | POA: Diagnosis not present

## 2023-01-25 DIAGNOSIS — E785 Hyperlipidemia, unspecified: Secondary | ICD-10-CM | POA: Diagnosis not present

## 2023-01-25 DIAGNOSIS — I10 Essential (primary) hypertension: Secondary | ICD-10-CM | POA: Diagnosis not present

## 2023-01-25 DIAGNOSIS — G63 Polyneuropathy in diseases classified elsewhere: Secondary | ICD-10-CM | POA: Diagnosis not present

## 2023-01-25 DIAGNOSIS — F17210 Nicotine dependence, cigarettes, uncomplicated: Secondary | ICD-10-CM | POA: Diagnosis not present

## 2023-01-25 DIAGNOSIS — Z8673 Personal history of transient ischemic attack (TIA), and cerebral infarction without residual deficits: Secondary | ICD-10-CM | POA: Diagnosis not present

## 2023-01-27 ENCOUNTER — Ambulatory Visit (HOSPITAL_COMMUNITY): Payer: 59

## 2023-02-26 ENCOUNTER — Emergency Department (HOSPITAL_COMMUNITY)
Admission: EM | Admit: 2023-02-26 | Discharge: 2023-02-26 | Disposition: A | Discharge status: Home / Self Care | Payer: 59 | Attending: Emergency Medicine | Admitting: Emergency Medicine

## 2023-02-26 ENCOUNTER — Emergency Department (HOSPITAL_COMMUNITY): Payer: 59

## 2023-02-26 ENCOUNTER — Other Ambulatory Visit: Payer: Self-pay

## 2023-02-26 ENCOUNTER — Encounter (HOSPITAL_COMMUNITY): Payer: Self-pay | Admitting: Radiology

## 2023-02-26 DIAGNOSIS — Z743 Need for continuous supervision: Secondary | ICD-10-CM | POA: Diagnosis not present

## 2023-02-26 DIAGNOSIS — Z7982 Long term (current) use of aspirin: Secondary | ICD-10-CM | POA: Insufficient documentation

## 2023-02-26 DIAGNOSIS — F10129 Alcohol abuse with intoxication, unspecified: Secondary | ICD-10-CM | POA: Insufficient documentation

## 2023-02-26 DIAGNOSIS — Z79899 Other long term (current) drug therapy: Secondary | ICD-10-CM | POA: Diagnosis not present

## 2023-02-26 DIAGNOSIS — F10920 Alcohol use, unspecified with intoxication, uncomplicated: Secondary | ICD-10-CM

## 2023-02-26 DIAGNOSIS — R5381 Other malaise: Secondary | ICD-10-CM | POA: Diagnosis not present

## 2023-02-26 DIAGNOSIS — R9431 Abnormal electrocardiogram [ECG] [EKG]: Secondary | ICD-10-CM | POA: Diagnosis not present

## 2023-02-26 DIAGNOSIS — W19XXXA Unspecified fall, initial encounter: Secondary | ICD-10-CM

## 2023-02-26 DIAGNOSIS — R404 Transient alteration of awareness: Secondary | ICD-10-CM | POA: Diagnosis not present

## 2023-02-26 DIAGNOSIS — R4182 Altered mental status, unspecified: Secondary | ICD-10-CM | POA: Insufficient documentation

## 2023-02-26 DIAGNOSIS — R0902 Hypoxemia: Secondary | ICD-10-CM | POA: Diagnosis not present

## 2023-02-26 LAB — COMPREHENSIVE METABOLIC PANEL
ALT: 10 U/L (ref 0–44)
AST: 17 U/L (ref 15–41)
Albumin: 3.8 g/dL (ref 3.5–5.0)
Alkaline Phosphatase: 80 U/L (ref 38–126)
Anion gap: 12 (ref 5–15)
BUN: <5 mg/dL — ABNORMAL LOW (ref 6–20)
CO2: 22 mmol/L (ref 22–32)
Calcium: 8.3 mg/dL — ABNORMAL LOW (ref 8.9–10.3)
Chloride: 103 mmol/L (ref 98–111)
Creatinine, Ser: 0.49 mg/dL (ref 0.44–1.00)
GFR, Estimated: >60 mL/min (ref >60)
Glucose, Bld: 108 mg/dL — ABNORMAL HIGH (ref 70–99)
Potassium: 3.7 mmol/L (ref 3.5–5.1)
Sodium: 137 mmol/L (ref 135–145)
Total Bilirubin: 0.3 mg/dL (ref 0.3–1.2)
Total Protein: 7.4 g/dL (ref 6.5–8.1)

## 2023-02-26 LAB — URINALYSIS, ROUTINE W REFLEX MICROSCOPIC
Bilirubin Urine: NEGATIVE
Glucose, UA: NEGATIVE mg/dL
Ketones, ur: NEGATIVE mg/dL
Leukocytes,Ua: NEGATIVE
Nitrite: NEGATIVE
Protein, ur: NEGATIVE mg/dL
Specific Gravity, Urine: 1.002 — ABNORMAL LOW (ref 1.005–1.030)
pH: 5 (ref 5.0–8.0)

## 2023-02-26 LAB — CBC WITH DIFFERENTIAL/PLATELET
Abs Immature Granulocytes: 0.02 10*3/uL (ref 0.00–0.07)
Basophils Absolute: 0.1 10*3/uL (ref 0.0–0.1)
Basophils Relative: 1 %
Eosinophils Absolute: 0 10*3/uL (ref 0.0–0.5)
Eosinophils Relative: 0 %
HCT: 43.2 % (ref 36.0–46.0)
Hemoglobin: 14.2 g/dL (ref 12.0–15.0)
Immature Granulocytes: 0 %
Lymphocytes Relative: 46 %
Lymphs Abs: 3.5 10*3/uL (ref 0.7–4.0)
MCH: 28 pg (ref 26.0–34.0)
MCHC: 32.9 g/dL (ref 30.0–36.0)
MCV: 85.2 fL (ref 80.0–100.0)
Monocytes Absolute: 0.6 10*3/uL (ref 0.1–1.0)
Monocytes Relative: 8 %
Neutro Abs: 3.4 10*3/uL (ref 1.7–7.7)
Neutrophils Relative %: 45 %
Platelets: 274 10*3/uL (ref 150–400)
RBC: 5.07 MIL/uL (ref 3.87–5.11)
RDW: 18.2 % — ABNORMAL HIGH (ref 11.5–15.5)
WBC: 7.6 10*3/uL (ref 4.0–10.5)
nRBC: 0 % (ref 0.0–0.2)

## 2023-02-26 LAB — RAPID URINE DRUG SCREEN, HOSP PERFORMED
Amphetamines: NOT DETECTED
Barbiturates: NOT DETECTED
Benzodiazepines: NOT DETECTED
Cocaine: NOT DETECTED
Opiates: NOT DETECTED
Tetrahydrocannabinol: POSITIVE — AB

## 2023-02-26 LAB — CBG MONITORING, ED: Glucose-Capillary: 131 mg/dL — ABNORMAL HIGH (ref 70–99)

## 2023-02-26 LAB — BLOOD GAS, VENOUS
Acid-base deficit: 0.8 mmol/L (ref 0.0–2.0)
Bicarbonate: 25.4 mmol/L (ref 20.0–28.0)
Drawn by: 442
O2 Saturation: 97.9 %
Patient temperature: 36.7
pCO2, Ven: 46 mmHg (ref 44–60)
pH, Ven: 7.34 (ref 7.25–7.43)
pO2, Ven: 85 mmHg — ABNORMAL HIGH (ref 32–45)

## 2023-02-26 LAB — ACETAMINOPHEN LEVEL: Acetaminophen (Tylenol), Serum: <10 ug/mL — ABNORMAL LOW (ref 10–30)

## 2023-02-26 LAB — AMMONIA: Ammonia: 56 umol/L — ABNORMAL HIGH (ref 9–35)

## 2023-02-26 LAB — SALICYLATE LEVEL: Salicylate Lvl: <7 mg/dL — ABNORMAL LOW (ref 7.0–30.0)

## 2023-02-26 LAB — ETHANOL: Alcohol, Ethyl (B): 409 mg/dL (ref <10)

## 2023-02-26 MED ORDER — ADULT MULTIVITAMIN W/MINERALS CH: 1.0000 | ORAL_TABLET | Freq: Every day | ORAL | Status: DC

## 2023-02-26 MED ORDER — THIAMINE HCL 100 MG/ML IJ SOLN: 100.0000 mg | Freq: Every day | INTRAMUSCULAR | Status: DC

## 2023-02-26 MED ORDER — THIAMINE MONONITRATE 100 MG PO TABS: 100.0000 mg | ORAL_TABLET | Freq: Every day | ORAL | Status: DC

## 2023-02-26 MED ORDER — FOLIC ACID 1 MG PO TABS: 1.0000 mg | ORAL_TABLET | Freq: Every day | ORAL | Status: DC

## 2023-02-26 NOTE — ED Notes (Signed)
Critical Lab Notification blood alcohol level 409, MD notified

## 2023-02-26 NOTE — Discharge Instructions (Signed)
Please follow-up with your primary doctor in 2 to 3 days and return to the emergency department if you experience any concerning symptoms.

## 2023-02-26 NOTE — ED Notes (Signed)
Ambulated to RR.  °

## 2023-02-26 NOTE — ED Provider Notes (Signed)
[place] EMERGENCY DEPARTMENT AT [facility] Provider Note   CSN: [id number] Arrival date & time: 02/26/23  1507     History {Add pertinent medical, surgical, social history, OB history to HPI:1} Chief Complaint  Patient presents with   Altered Mental Status    Patient BIB by EMS for AMS, found on her face in a motel with her daughter, vomiting, hypoxic, put on 2L.     Ashley Rojas is a 57 y.o. female.  57 year old female with a history of alcohol abuse, crack cocaine abuse, marijuana use, and possible left thalamic stroke who presents to the emergency department with altered mental status and vomiting.  History obtained via daughter Ashley Rojas [phone] Earlier today she was at [employer] with her other daughter Ashley Rojas) and was drinking today. Has been drinking beer recently but no liquor. Does have a history of alcoholism. Was drinking lots of liquor. After leaving the bar she kept throwing up and was reported that she was unresponsive. Says her other sister was drunk as well and called 911. No other recreational substance use recently other than marijuana. Did use crack and marijuana previously though. Takes gabapentin but no other medications for pain or opiates to her knowledge. No liver disease. Does not believe she is on blood thinners. Pt intoxicated appearing and unable to give additional history at this time.        Home Medications Prior to Admission medications   Medication Sig Start Date End Date Taking? Authorizing Provider  amLODipine (NORVASC) 10 MG tablet Take 10 mg by mouth daily. 05/29/21   [provider]  aspirin 81 MG chewable tablet Chew 162 mg by mouth 2 (two) times daily as needed for mild pain or moderate pain.     [provider]  Aspirin-Acetaminophen-Caffeine (GOODY HEADACHE PO) Take 1 packet by mouth daily as needed (pain).    [provider]  atenolol (TENORMIN) 50 MG tablet Take 50 mg by mouth daily.  05/29/21   [provider]  baclofen (LIORESAL) 10 MG tablet Take 1 tablet (10 mg total) by mouth 3 (three) times daily as needed for muscle spasms. 01/06/23   Drema Dallas, DO  DULoxetine (CYMBALTA) 30 MG capsule Take 30 mg by mouth daily. 05/29/21   [provider]  gabapentin (NEURONTIN) 600 MG tablet Take 1 tablet (600 mg total) by mouth 4 (four) times daily. 01/06/23   Drema Dallas, DO  HYDROcodone-acetaminophen (NORCO/VICODIN) 5-325 MG tablet Take 2 tablets by mouth every 4 (four) hours as needed. Patient not taking: Reported on 06/18/2021 05/14/19   Vanetta Mulders, MD  naproxen (NAPROSYN) 250 MG tablet Take 1 tablet (250 mg total) by mouth 2 (two) times daily as needed for mild pain or moderate pain (take with food). 07/25/18   Samuel Jester, DO  zolpidem (AMBIEN) 10 MG tablet Take 10 mg by mouth daily. 05/29/21   [provider]      Allergies    Patient has no known allergies.    Review of Systems   Review of Systems  Physical Exam Updated Vital Signs There were no vitals taken for this visit. Physical Exam Vitals and nursing note reviewed.  Constitutional:      General: She is not in acute distress.    Appearance: She is well-developed.     Comments: Drowsy and intoxicated appearing  HENT:     Head: Normocephalic and atraumatic.     Mouth/Throat:     Mouth: Mucous membranes are moist.  Pharynx: Oropharynx is clear.  Eyes:     Extraocular Movements: Extraocular movements intact.     Conjunctiva/sclera: Conjunctivae normal.     Pupils: Pupils are equal, round, and reactive to light.     Comments: Pupils 5 mm bilaterally  Cardiovascular:     Rate and Rhythm: Normal rate and regular rhythm.     Heart sounds: No murmur heard. Pulmonary:     Effort: Pulmonary effort is normal. No respiratory distress.     Breath sounds: Normal breath sounds.     Comments: On 2 L nasal cannula Musculoskeletal:        General: No swelling.     Cervical back:  Neck supple.  Skin:    General: Skin is warm and dry.     Capillary Refill: Capillary refill takes less than 2 seconds.  Neurological:     Comments: Oriented to self.  Slurred speech.  Cranial nerves appear grossly intact and patient was able to smile for me without any facial droop.  Withdraws all 4 extremities to pain.  Not cooperating with remainder of exam.  Psychiatric:        Mood and Affect: Mood normal.     ED Results / Procedures / Treatments   Labs (all labs ordered are listed, but only abnormal results are displayed) Labs Reviewed  COMPREHENSIVE METABOLIC PANEL  CBC WITH DIFFERENTIAL/PLATELET  URINALYSIS, ROUTINE W REFLEX MICROSCOPIC  AMMONIA  BLOOD GAS, VENOUS  RAPID URINE DRUG SCREEN, HOSP PERFORMED  ETHANOL  ACETAMINOPHEN LEVEL  SALICYLATE LEVEL  CBG MONITORING, ED    EKG None  Radiology No results found.  Procedures Procedures   Medications Ordered in ED Medications - No data to display  ED Course/ Medical Decision Making/ A&P                             Medical Decision Making Amount and/or Complexity of Data Reviewed Labs: ordered. Radiology: ordered.   Ashley Rojas is a 57 y.o. female with comorbidities that complicate the patient evaluation including ***   Initial Ddx:  ***   MDM:  ***  Plan:  ***  ED Summary/Re-evaluation:  ***  This patient presents to the ED for concern of complaints listed in HPI, this involves an extensive number of treatment options, and is a complaint that carries with it a high risk of complications and morbidity. Disposition including potential need for admission considered.   Dispo: {Disposition:28069}  Additional history obtained from {Additional History:28067} Records reviewed {Records Reviewed:28068} The following labs were independently interpreted: {labs interpreted:28064} and show {lab findings:28250} I independently reviewed the following imaging with scope of interpretation limited to  determining acute life threatening conditions related to emergency care: {imaging interpreted:28065} and agree with the radiologist interpretation with the following exceptions: *** I personally reviewed and interpreted cardiac monitoring: {cardiac monitoring:28251} I personally reviewed and interpreted the pt's EKG: see above for interpretation  I have reviewed the patients home medications and made adjustments as needed Consults: {Consultants:28063} Social Determinants of health:  ***  {Document critical care time when appropriate:1} {Document POCUS if Performed:1}   {Document critical care time when appropriate:1} {Document review of labs and clinical decision tools ie heart score, Chads2Vasc2 etc:1}  {Document your independent review of radiology images, and any outside records:1} {Document your discussion with family members, caretakers, and with consultants:1} {Document social determinants of health affecting pt's care:1} {Document your decision making why or why not admission, treatments were needed:1}  Final Clinical Impression(s) / ED Diagnoses Final diagnoses:  None    Rx / DC Orders ED Discharge Orders     None

## 2023-05-10 DIAGNOSIS — G63 Polyneuropathy in diseases classified elsewhere: Secondary | ICD-10-CM | POA: Diagnosis not present

## 2023-05-10 DIAGNOSIS — I10 Essential (primary) hypertension: Secondary | ICD-10-CM | POA: Diagnosis not present

## 2023-05-10 DIAGNOSIS — F17210 Nicotine dependence, cigarettes, uncomplicated: Secondary | ICD-10-CM | POA: Diagnosis not present

## 2023-05-10 DIAGNOSIS — E785 Hyperlipidemia, unspecified: Secondary | ICD-10-CM | POA: Diagnosis not present

## 2023-08-30 ENCOUNTER — Other Ambulatory Visit: Payer: Self-pay

## 2023-08-30 ENCOUNTER — Telehealth: Payer: Self-pay | Admitting: Neurology

## 2023-08-30 NOTE — Telephone Encounter (Signed)
Gabapentin Cymbalta 30 mg  Cymbalta 60 mg  Pt needs a refill on these medications   [facility]  [address] [place]

## 2023-08-31 ENCOUNTER — Other Ambulatory Visit: Payer: Self-pay | Admitting: Neurology

## 2023-08-31 MED ORDER — GABAPENTIN 600 MG PO TABS: 600.0000 mg | ORAL_TABLET | Freq: Four times a day (QID) | ORAL | 5 refills | Status: DC

## 2023-08-31 MED ORDER — DULOXETINE HCL 30 MG PO CPEP: 30.0000 mg | ORAL_CAPSULE | Freq: Every day | ORAL | 5 refills | Status: DC

## 2023-08-31 MED ORDER — DULOXETINE HCL 60 MG PO CPEP: 60.0000 mg | ORAL_CAPSULE | Freq: Every day | ORAL | 3 refills | Status: DC

## 2023-08-31 NOTE — Telephone Encounter (Signed)
Patient advise of Dr.Jaffe note, Prescriptions for all 3 medications have been sent to the [facility].

## 2023-09-09 ENCOUNTER — Telehealth: Payer: Self-pay | Admitting: Neurology

## 2023-09-09 NOTE — Telephone Encounter (Signed)
Gabapentin 600mg  Duloxetine 30mg  and 60mg   Please send to [facility] in [place]  Please call patient [phone]

## 2023-09-10 ENCOUNTER — Other Ambulatory Visit: Payer: Self-pay | Admitting: Neurology

## 2023-09-10 MED ORDER — DULOXETINE HCL 60 MG PO CPEP: 60.0000 mg | ORAL_CAPSULE | Freq: Every day | ORAL | 0 refills | Status: DC

## 2023-09-10 MED ORDER — GABAPENTIN 600 MG PO TABS: 600.0000 mg | ORAL_TABLET | Freq: Four times a day (QID) | ORAL | 0 refills | Status: DC

## 2023-09-10 MED ORDER — DULOXETINE HCL 30 MG PO CPEP: 30.0000 mg | ORAL_CAPSULE | Freq: Every day | ORAL | 0 refills | Status: DC

## 2023-09-10 NOTE — Telephone Encounter (Signed)
Done

## 2023-09-20 ENCOUNTER — Other Ambulatory Visit (HOSPITAL_COMMUNITY): Payer: Self-pay | Admitting: Gerontology

## 2023-09-20 ENCOUNTER — Ambulatory Visit (HOSPITAL_COMMUNITY)
Admission: RE | Admit: 2023-09-20 | Discharge: 2023-09-20 | Disposition: A | Discharge status: Home / Self Care | Payer: 59 | Source: Ambulatory Visit | Attending: Gerontology | Admitting: Gerontology

## 2023-09-20 DIAGNOSIS — M25511 Pain in right shoulder: Secondary | ICD-10-CM

## 2023-09-28 NOTE — Progress Notes (Deleted)
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 For left sided muscle spasms, baclofen 10mg  TID PRN Refer to physical therapy for neck pain Secondary stroke prevention as managed by PCP: ASA 325mg  daily LDL goal less than 70.  You may need to be on a statin.  Recommend discussing with your PCP. Normotensive blood pressure Hgb A1c goal less than 7 Smoking cessation Follow up 5 months.    Subjective:  Ashley Rojas is a 57 year old female with hypertension, cigarette smoker and history of MRI-negative right thalamic stroke with chronic thalamic pain syndrome follows up for headache and thalamic pain syndrome.  UPDATE: Thalamic Pain Syndrome: Taking duloxetine 90mg  daily and gabapentin 600mg  every 6 hours. ***  Headaches: ***  Left sided Neck pain/radiculopathy: Prescribed baclofen and referred to PT for neck pain.    Current NSAIDS/analgesics:  ASA 325mg  daily Current triptans:  CONTRAINDICATED Current ergotamine:  none Current anti-emetic:  none Current muscle relaxants:  baclofen 10mg  TID PRN Current Antihypertensive medications:  atenolol 50mg  BID, amlodipine 10mg  QD Current Antidepressant medications:  duloxetine 90mg  daily (chronic pain) Current Anticonvulsant medications:  gabapentin 600mg  Q6h Current anti-CGRP:  none Current Vitamins/Herbal/Supplements:  none Current Antihistamines/Decongestants:  none  HISTORY: Right Thalamic Pain Syndrome: She had a presumed DWI-negative right thalamic stroke in June 2020.  She presented to the ED with acute onset of left sided upper and lower extremity numbness.  No weakness,  speech disturbance, facial droop.  MRI of brain was negative for acute intracranial abnormality or significant white matter disease.  MRA of head showed no LVO, significant stenosis or aneurysm.  Within a couple of days she developed burning pain involving the left leg but not in a dermatomal distribution.  Sometimes she experiences painful spasms on the left side of her torso.  No pain in the arm but sometimes it "jumps". More recently, she has been experiencing left sided neck pain.  If she turns her head to the left, she feels a shooting pain up the neck and back of the head.  Sometimes her left arm will "jerk".   Headaches: Longstanding history of headaches that became worse after stroke.  Either side, sharp.  Sometimes nausea, no photophobia or phonophobia.  Typically lasts 45 minutes with Tylenol.  They occur twice a week.    Past NSAIDS/analgesics:  hydrocodone Past abortive triptans:  CONTRAINDICATED Past abortive ergotamine:  none Past muscle relaxants:  Robaxin Past anti-emetic:  none Past antihypertensive medications:  none Past antidepressant medications:  none Past anticonvulsant medications:  none Past anti-CGRP:  none Past vitamins/Herbal/Supplements:  none Past antihistamines/decongestants:  none Other past therapies:  none    PAST MEDICAL HISTORY: Past Medical History:  Diagnosis Date   History of ileus 2004   post-op    Kidney stone    stones    MEDICATIONS: Current Outpatient Medications on File Prior to Visit  Medication Sig Dispense Refill   amLODipine (NORVASC) 10 MG tablet Take 10 mg by mouth daily.     aspirin 81 MG chewable tablet Chew 162 mg by mouth 2 (two) times daily as needed for mild pain or moderate pain.      Aspirin-Acetaminophen-Caffeine (GOODY HEADACHE PO) Take 1 packet by mouth daily as  needed (pain).     atenolol (TENORMIN) 50 MG tablet Take 50 mg by mouth daily.     baclofen (LIORESAL) 10 MG tablet Take 1 tablet (10 mg total) by mouth 3  (three) times daily as needed for muscle spasms. 90 each 5   DULoxetine (CYMBALTA) 30 MG capsule Take 1 capsule (30 mg total) by mouth daily. Take with 60mg  capsule. 30 capsule 0   DULoxetine (CYMBALTA) 60 MG capsule Take 1 capsule (60 mg total) by mouth daily. Take with 30mg  capsule. 30 capsule 0   gabapentin (NEURONTIN) 600 MG tablet Take 1 tablet (600 mg total) by mouth 4 (four) times daily. 120 tablet 0   HYDROcodone-acetaminophen (NORCO/VICODIN) 5-325 MG tablet Take 2 tablets by mouth every 4 (four) hours as needed. (Patient not taking: Reported on 06/18/2021) 6 tablet 0   naproxen (NAPROSYN) 250 MG tablet Take 1 tablet (250 mg total) by mouth 2 (two) times daily as needed for mild pain or moderate pain (take with food). 14 tablet 0   zolpidem (AMBIEN) 10 MG tablet Take 10 mg by mouth daily.     No current facility-administered medications on file prior to visit.    ALLERGIES: No Known Allergies  FAMILY HISTORY: No family history on file.    Objective:  *** General: No acute distress.  Patient appears ***-groomed.   Head:  Normocephalic/atraumatic Eyes:  Fundi examined but not visualized Neck: supple, no paraspinal tenderness, full range of motion Heart:  Regular rate and rhythm Lungs:  Clear to auscultation bilaterally Back: No paraspinal tenderness Neurological Exam: alert and oriented.  Speech fluent and not dysarthric, language intact.  CN II-XII intact. Bulk and tone normal, muscle strength 5/5 throughout.  Sensation to light touch intact.  Deep tendon reflexes 2+ throughout, toes downgoing.  Finger to nose testing intact.  Gait normal, Romberg negative.   Shon Millet, DO  CC: ***

## 2023-09-29 ENCOUNTER — Ambulatory Visit: Payer: 59 | Admitting: Neurology

## 2023-10-18 NOTE — Progress Notes (Unsigned)
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   Right arm numbness, may be radicular or mononeuropathy (CTS)   For neuralgia:  duloxetine 90mg  daily and gabapentin 600mg  every 6 hours (or 1200mg  twice daily). For left sided muscle spasms, baclofen 10mg  TID PRN Refer to physical therapy for neck pain Follow up 6 months.    Subjective:  Ashley Rojas is a 57 year old female with hypertension, cigarette smoker and history of MRI-negative right thalamic stroke with chronic thalamic pain syndrome follows up for headache and thalamic pain syndrome.  UPDATE: Thalamic Pain Syndrome: Taking duloxetine 90mg  daily and gabapentin 600mg  every 4 hours (she takes it 1200mg  in morning and 1200mg  at night). Pain is manageable.   Left sided Neck pain/radiculopathy: Prescribed baclofen and referred to PT for neck pain.  Never received baclofen because it went to the wrong pharmacy.  Never received a call to schedule PT.    She received flu shot in the right upper arm on 10/26.  Since then , reports numbness in the arm down to the hand.  No pain or weakness.  10/28 right shoulder X-ray negative  Current NSAIDS/analgesics:  ASA 325mg  daily Current triptans:  CONTRAINDICATED Current ergotamine:  none Current anti-emetic:  none Current muscle relaxants:  baclofen 10mg  TID PRN Current Antihypertensive medications:  atenolol 50mg  BID, amlodipine 10mg  QD Current Antidepressant medications:  duloxetine 90mg  daily (chronic pain) Current Anticonvulsant medications:  gabapentin 600mg  Q6h Current anti-CGRP:  none Current Vitamins/Herbal/Supplements:  none Current Antihistamines/Decongestants:  none  HISTORY: Right Thalamic Pain Syndrome: She had a presumed DWI-negative right thalamic stroke in June 2020.  She presented to the ED with acute onset of left sided upper  and lower extremity numbness.  No weakness, speech disturbance, facial droop.  MRI of brain was negative for acute intracranial abnormality or significant white matter disease.  MRA of head showed no LVO, significant stenosis or aneurysm.  Within a couple of days she developed burning pain involving the left leg but not in a dermatomal distribution.  Sometimes she experiences painful spasms on the left side of her torso.  No pain in the arm but sometimes it "jumps". More recently, she has been experiencing left sided neck pain.  If she turns her head to the left, she feels a shooting pain up the neck and back of the head.  Sometimes her left arm will "jerk".   Headaches: Longstanding history of headaches that became worse after stroke.  Either side, sharp.  Sometimes nausea, no photophobia or phonophobia.  Typically lasts 45 minutes with Tylenol.  They occur twice a week.    Past NSAIDS/analgesics:  hydrocodone Past abortive triptans:  CONTRAINDICATED Past abortive ergotamine:  none Past muscle relaxants:  Robaxin Past anti-emetic:  none Past antihypertensive medications:  none Past antidepressant medications:  none Past anticonvulsant medications:  none Past anti-CGRP:  none Past vitamins/Herbal/Supplements:  none Past antihistamines/decongestants:  none Other past therapies:  none    PAST MEDICAL HISTORY: Past Medical History:  Diagnosis Date   History of ileus 2004   post-op    Kidney stone    stones    MEDICATIONS: Current Outpatient Medications on File Prior to Visit  Medication Sig Dispense Refill   amLODipine (NORVASC) 10 MG tablet Take 10 mg by mouth daily.     aspirin 81 MG chewable tablet Chew 162 mg by mouth 2 (two) times daily as  needed for mild pain or moderate pain.      Aspirin-Acetaminophen-Caffeine (GOODY HEADACHE PO) Take 1 packet by mouth daily as needed (pain).     atenolol (TENORMIN) 50 MG tablet Take 50 mg by mouth daily.     baclofen (LIORESAL) 10 MG tablet  Take 1 tablet (10 mg total) by mouth 3 (three) times daily as needed for muscle spasms. 90 each 5   DULoxetine (CYMBALTA) 30 MG capsule Take 1 capsule (30 mg total) by mouth daily. Take with 60mg  capsule. 30 capsule 0   DULoxetine (CYMBALTA) 60 MG capsule Take 1 capsule (60 mg total) by mouth daily. Take with 30mg  capsule. 30 capsule 0   gabapentin (NEURONTIN) 600 MG tablet Take 1 tablet (600 mg total) by mouth 4 (four) times daily. 120 tablet 0   HYDROcodone-acetaminophen (NORCO/VICODIN) 5-325 MG tablet Take 2 tablets by mouth every 4 (four) hours as needed. (Patient not taking: Reported on 06/18/2021) 6 tablet 0   naproxen (NAPROSYN) 250 MG tablet Take 1 tablet (250 mg total) by mouth 2 (two) times daily as needed for mild pain or moderate pain (take with food). 14 tablet 0   zolpidem (AMBIEN) 10 MG tablet Take 10 mg by mouth daily.     No current facility-administered medications on file prior to visit.    ALLERGIES: No Known Allergies  FAMILY HISTORY: No family history on file.    Objective:  Blood pressure 126/76, pulse 75, height 5\' 4"  (1.626 m), weight 139 lb 6.4 oz (63.2 kg), SpO2 94%. General: No acute distress.  Patient appears well-groomed.   Head:  Normocephalic/atraumatic Eyes:  Fundi examined but not visualized Neck: supple, no paraspinal tenderness, full range of motion Heart:  Regular rate and rhythm Lungs:  Clear to auscultation bilaterally Back: No paraspinal tenderness Neurological Exam: alert and oriented.  Speech fluent and not dysarthric, language intact.  CN II-XII intact. Bulk and tone normal, muscle strength 5/5 throughout.  Sensation to light touch intact.  Deep tendon reflexes 2+ throughout, toes downgoing.  Finger to nose testing intact.  Gait normal, Romberg negative.   Shon Millet, DO  CC: Avon Gully, MD

## 2023-10-19 ENCOUNTER — Encounter: Payer: Self-pay | Admitting: Neurology

## 2023-10-19 ENCOUNTER — Ambulatory Visit: Payer: Medicaid Other | Admitting: Neurology

## 2023-10-19 VITALS — BP 126/76 | HR 75 | Ht 64.0 in | Wt 139.4 lb

## 2023-10-19 DIAGNOSIS — R200 Anesthesia of skin: Secondary | ICD-10-CM

## 2023-10-19 DIAGNOSIS — M542 Cervicalgia: Secondary | ICD-10-CM

## 2023-10-19 DIAGNOSIS — G890 Central pain syndrome: Secondary | ICD-10-CM

## 2023-10-19 DIAGNOSIS — R202 Paresthesia of skin: Secondary | ICD-10-CM | POA: Diagnosis not present

## 2023-10-19 MED ORDER — BACLOFEN 10 MG PO TABS: 10.0000 mg | ORAL_TABLET | Freq: Three times a day (TID) | ORAL | 5 refills | Status: DC | PRN

## 2023-10-19 NOTE — Patient Instructions (Signed)
Send to physical therapy for neck pain and possible right sided radiculopathy.  If you do not receive a call in 2 weeks, contact us Gabapentin 1200mg  twice daily Duloxetine 90mg  daily Baclofen Follow up 6 months.

## 2023-11-12 ENCOUNTER — Other Ambulatory Visit (HOSPITAL_COMMUNITY): Payer: Self-pay | Admitting: Gerontology

## 2023-11-12 ENCOUNTER — Other Ambulatory Visit (HOSPITAL_COMMUNITY)
Admission: RE | Admit: 2023-11-12 | Discharge: 2023-11-12 | Disposition: A | Discharge status: Home / Self Care | Payer: Medicaid Other | Source: Ambulatory Visit | Attending: Internal Medicine | Admitting: Internal Medicine

## 2023-11-12 ENCOUNTER — Ambulatory Visit (HOSPITAL_COMMUNITY)
Admission: RE | Admit: 2023-11-12 | Discharge: 2023-11-12 | Disposition: A | Discharge status: Home / Self Care | Payer: Medicaid Other | Source: Ambulatory Visit | Attending: Gerontology | Admitting: Gerontology

## 2023-11-12 DIAGNOSIS — R059 Cough, unspecified: Secondary | ICD-10-CM | POA: Insufficient documentation

## 2023-11-12 DIAGNOSIS — E785 Hyperlipidemia, unspecified: Secondary | ICD-10-CM | POA: Insufficient documentation

## 2023-11-12 LAB — LIPID PANEL
Cholesterol: 195 mg/dL (ref 0–200)
HDL: 59 mg/dL (ref >40)
LDL Cholesterol: 116 mg/dL — ABNORMAL HIGH (ref 0–99)
Total CHOL/HDL Ratio: 3.3 {ratio}
Triglycerides: 100 mg/dL (ref <150)
VLDL: 20 mg/dL (ref 0–40)

## 2024-02-03 ENCOUNTER — Observation Stay (HOSPITAL_COMMUNITY)
Admission: EM | Admit: 2024-02-03 | Discharge: 2024-02-04 | Disposition: A | Discharge status: Home / Self Care | Attending: Family Medicine | Admitting: Family Medicine

## 2024-02-03 ENCOUNTER — Encounter (HOSPITAL_COMMUNITY): Payer: Self-pay

## 2024-02-03 ENCOUNTER — Emergency Department (HOSPITAL_COMMUNITY)

## 2024-02-03 ENCOUNTER — Other Ambulatory Visit: Payer: Self-pay

## 2024-02-03 DIAGNOSIS — Z1152 Encounter for screening for COVID-19: Secondary | ICD-10-CM | POA: Insufficient documentation

## 2024-02-03 DIAGNOSIS — Z720 Tobacco use: Secondary | ICD-10-CM

## 2024-02-03 DIAGNOSIS — J101 Influenza due to other identified influenza virus with other respiratory manifestations: Secondary | ICD-10-CM | POA: Diagnosis not present

## 2024-02-03 DIAGNOSIS — J441 Chronic obstructive pulmonary disease with (acute) exacerbation: Principal | ICD-10-CM | POA: Insufficient documentation

## 2024-02-03 DIAGNOSIS — G629 Polyneuropathy, unspecified: Secondary | ICD-10-CM | POA: Insufficient documentation

## 2024-02-03 DIAGNOSIS — Z7901 Long term (current) use of anticoagulants: Secondary | ICD-10-CM | POA: Diagnosis not present

## 2024-02-03 DIAGNOSIS — J9601 Acute respiratory failure with hypoxia: Secondary | ICD-10-CM | POA: Diagnosis not present

## 2024-02-03 DIAGNOSIS — Z79899 Other long term (current) drug therapy: Secondary | ICD-10-CM | POA: Insufficient documentation

## 2024-02-03 DIAGNOSIS — I10 Essential (primary) hypertension: Secondary | ICD-10-CM | POA: Insufficient documentation

## 2024-02-03 DIAGNOSIS — F1010 Alcohol abuse, uncomplicated: Secondary | ICD-10-CM | POA: Insufficient documentation

## 2024-02-03 DIAGNOSIS — Z8673 Personal history of transient ischemic attack (TIA), and cerebral infarction without residual deficits: Secondary | ICD-10-CM | POA: Insufficient documentation

## 2024-02-03 DIAGNOSIS — F17210 Nicotine dependence, cigarettes, uncomplicated: Secondary | ICD-10-CM | POA: Diagnosis not present

## 2024-02-03 DIAGNOSIS — R0602 Shortness of breath: Secondary | ICD-10-CM | POA: Diagnosis present

## 2024-02-03 HISTORY — DX: Essential (primary) hypertension: I10

## 2024-02-03 LAB — COMPREHENSIVE METABOLIC PANEL
ALT: 11 U/L (ref 0–44)
AST: 17 U/L (ref 15–41)
Albumin: 3.4 g/dL — ABNORMAL LOW (ref 3.5–5.0)
Alkaline Phosphatase: 70 U/L (ref 38–126)
Anion gap: 13 (ref 5–15)
BUN: 11 mg/dL (ref 6–20)
CO2: 23 mmol/L (ref 22–32)
Calcium: 8.6 mg/dL — ABNORMAL LOW (ref 8.9–10.3)
Chloride: 98 mmol/L (ref 98–111)
Creatinine, Ser: 0.74 mg/dL (ref 0.44–1.00)
GFR, Estimated: >60 mL/min (ref >60)
Glucose, Bld: 101 mg/dL — ABNORMAL HIGH (ref 70–99)
Potassium: 3.8 mmol/L (ref 3.5–5.1)
Sodium: 134 mmol/L — ABNORMAL LOW (ref 135–145)
Total Bilirubin: 0.3 mg/dL (ref 0.0–1.2)
Total Protein: 7.2 g/dL (ref 6.5–8.1)

## 2024-02-03 LAB — RESP PANEL BY RT-PCR (RSV, FLU A&B, COVID)  RVPGX2
Influenza A by PCR: POSITIVE — AB
Influenza B by PCR: NEGATIVE
Resp Syncytial Virus by PCR: NEGATIVE
SARS Coronavirus 2 by RT PCR: NEGATIVE

## 2024-02-03 LAB — CBC WITH DIFFERENTIAL/PLATELET
Abs Immature Granulocytes: 0.03 10*3/uL (ref 0.00–0.07)
Basophils Absolute: 0 10*3/uL (ref 0.0–0.1)
Basophils Relative: 0 %
Eosinophils Absolute: 0 10*3/uL (ref 0.0–0.5)
Eosinophils Relative: 0 %
HCT: 39.9 % (ref 36.0–46.0)
Hemoglobin: 12.7 g/dL (ref 12.0–15.0)
Immature Granulocytes: 1 %
Lymphocytes Relative: 26 %
Lymphs Abs: 1.5 10*3/uL (ref 0.7–4.0)
MCH: 28.4 pg (ref 26.0–34.0)
MCHC: 31.8 g/dL (ref 30.0–36.0)
MCV: 89.3 fL (ref 80.0–100.0)
Monocytes Absolute: 0.7 10*3/uL (ref 0.1–1.0)
Monocytes Relative: 12 %
Neutro Abs: 3.5 10*3/uL (ref 1.7–7.7)
Neutrophils Relative %: 61 %
Platelets: 242 10*3/uL (ref 150–400)
RBC: 4.47 MIL/uL (ref 3.87–5.11)
RDW: 15.9 % — ABNORMAL HIGH (ref 11.5–15.5)
WBC: 5.8 10*3/uL (ref 4.0–10.5)
nRBC: 0 % (ref 0.0–0.2)

## 2024-02-03 LAB — BRAIN NATRIURETIC PEPTIDE: B Natriuretic Peptide: 83 pg/mL (ref 0.0–100.0)

## 2024-02-03 MED ORDER — PANTOPRAZOLE SODIUM 40 MG PO TBEC
40.0000 mg | DELAYED_RELEASE_TABLET | Freq: Every day | ORAL | Status: DC
  Administered 2024-02-03 – 2024-02-04 (×2): 40 mg via ORAL
  Filled 2024-02-03 (×2): qty 1

## 2024-02-03 MED ORDER — LORAZEPAM 1 MG PO TABS: 1.0000 mg | ORAL_TABLET | ORAL | Status: DC | PRN

## 2024-02-03 MED ORDER — IPRATROPIUM-ALBUTEROL 0.5-2.5 (3) MG/3ML IN SOLN
3.0000 mL | Freq: Four times a day (QID) | RESPIRATORY_TRACT | Status: DC
  Administered 2024-02-03 – 2024-02-04 (×2): 3 mL via RESPIRATORY_TRACT
  Filled 2024-02-03 (×2): qty 3

## 2024-02-03 MED ORDER — OSELTAMIVIR PHOSPHATE 75 MG PO CAPS
75.0000 mg | ORAL_CAPSULE | ORAL | Status: AC
Start: 2024-02-03 — End: 2024-02-03
  Administered 2024-02-03: 75 mg via ORAL
  Filled 2024-02-03: qty 1

## 2024-02-03 MED ORDER — THIAMINE HCL 100 MG/ML IJ SOLN: 100.0000 mg | Freq: Every day | INTRAMUSCULAR | Status: DC

## 2024-02-03 MED ORDER — DULOXETINE HCL 30 MG PO CPEP: 60.0000 mg | ORAL_CAPSULE | Freq: Every day | ORAL | Status: DC

## 2024-02-03 MED ORDER — METHYLPREDNISOLONE SODIUM SUCC 125 MG IJ SOLR
125.0000 mg | Freq: Every day | INTRAMUSCULAR | Status: DC
  Administered 2024-02-04: 125 mg via INTRAVENOUS
  Filled 2024-02-03: qty 2

## 2024-02-03 MED ORDER — IPRATROPIUM-ALBUTEROL 0.5-2.5 (3) MG/3ML IN SOLN: 3.0000 mL | Freq: Four times a day (QID) | RESPIRATORY_TRACT | Status: DC

## 2024-02-03 MED ORDER — ADULT MULTIVITAMIN W/MINERALS CH
1.0000 | ORAL_TABLET | Freq: Every day | ORAL | Status: DC
  Administered 2024-02-03 – 2024-02-04 (×2): 1 via ORAL
  Filled 2024-02-03 (×2): qty 1

## 2024-02-03 MED ORDER — GABAPENTIN 300 MG PO CAPS
600.0000 mg | ORAL_CAPSULE | Freq: Four times a day (QID) | ORAL | Status: DC
  Administered 2024-02-03 – 2024-02-04 (×3): 600 mg via ORAL
  Filled 2024-02-03 (×3): qty 2

## 2024-02-03 MED ORDER — ACETAMINOPHEN 325 MG PO TABS: 650.0000 mg | ORAL_TABLET | Freq: Four times a day (QID) | ORAL | Status: DC | PRN

## 2024-02-03 MED ORDER — LORAZEPAM 2 MG/ML IJ SOLN: 1.0000 mg | INTRAMUSCULAR | Status: DC | PRN

## 2024-02-03 MED ORDER — OSELTAMIVIR PHOSPHATE 75 MG PO CAPS
75.0000 mg | ORAL_CAPSULE | Freq: Two times a day (BID) | ORAL | Status: DC
  Administered 2024-02-03 – 2024-02-04 (×2): 75 mg via ORAL
  Filled 2024-02-03 (×2): qty 1

## 2024-02-03 MED ORDER — THIAMINE MONONITRATE 100 MG PO TABS
100.0000 mg | ORAL_TABLET | Freq: Every day | ORAL | Status: DC
  Administered 2024-02-03 – 2024-02-04 (×2): 100 mg via ORAL
  Filled 2024-02-03 (×2): qty 1

## 2024-02-03 MED ORDER — HYDRALAZINE HCL 20 MG/ML IJ SOLN: 5.0000 mg | INTRAMUSCULAR | Status: DC | PRN

## 2024-02-03 MED ORDER — HEPARIN SODIUM (PORCINE) 5000 UNIT/ML IJ SOLN
5000.0000 [IU] | Freq: Three times a day (TID) | INTRAMUSCULAR | Status: DC
  Administered 2024-02-03 – 2024-02-04 (×2): 5000 [IU] via SUBCUTANEOUS
  Filled 2024-02-03 (×2): qty 1

## 2024-02-03 MED ORDER — IPRATROPIUM-ALBUTEROL 0.5-2.5 (3) MG/3ML IN SOLN
9.0000 mL | Freq: Once | RESPIRATORY_TRACT | Status: AC
  Administered 2024-02-03: 9 mL via RESPIRATORY_TRACT
  Filled 2024-02-03: qty 9

## 2024-02-03 MED ORDER — NICOTINE 21 MG/24HR TD PT24
21.0000 mg | MEDICATED_PATCH | Freq: Every day | TRANSDERMAL | Status: DC
  Administered 2024-02-04: 21 mg via TRANSDERMAL
  Filled 2024-02-03 (×2): qty 1

## 2024-02-03 MED ORDER — ALBUTEROL SULFATE (2.5 MG/3ML) 0.083% IN NEBU
5.0000 mg | INHALATION_SOLUTION | Freq: Once | RESPIRATORY_TRACT | Status: AC
  Administered 2024-02-03: 5 mg via RESPIRATORY_TRACT
  Filled 2024-02-03: qty 6

## 2024-02-03 MED ORDER — ASPIRIN 325 MG PO TBEC
325.0000 mg | DELAYED_RELEASE_TABLET | Freq: Every day | ORAL | Status: DC
  Administered 2024-02-04: 325 mg via ORAL
  Filled 2024-02-03: qty 1

## 2024-02-03 MED ORDER — METHYLPREDNISOLONE SODIUM SUCC 125 MG IJ SOLR
125.0000 mg | Freq: Once | INTRAMUSCULAR | Status: AC
  Administered 2024-02-03: 125 mg via INTRAVENOUS
  Filled 2024-02-03: qty 2

## 2024-02-03 MED ORDER — ACETAMINOPHEN 650 MG RE SUPP: 650.0000 mg | Freq: Four times a day (QID) | RECTAL | Status: DC | PRN

## 2024-02-03 MED ORDER — DULOXETINE HCL 60 MG PO CPEP
90.0000 mg | ORAL_CAPSULE | Freq: Every day | ORAL | Status: DC
  Administered 2024-02-03: 90 mg via ORAL
  Filled 2024-02-03 (×2): qty 1

## 2024-02-03 MED ORDER — AMLODIPINE BESYLATE 5 MG PO TABS
10.0000 mg | ORAL_TABLET | Freq: Every day | ORAL | Status: DC
  Administered 2024-02-04: 10 mg via ORAL
  Filled 2024-02-03: qty 2

## 2024-02-03 MED ORDER — FOLIC ACID 1 MG PO TABS
1.0000 mg | ORAL_TABLET | Freq: Every day | ORAL | Status: DC
  Administered 2024-02-03 – 2024-02-04 (×2): 1 mg via ORAL
  Filled 2024-02-03 (×2): qty 1

## 2024-02-03 MED ORDER — ATENOLOL 25 MG PO TABS
50.0000 mg | ORAL_TABLET | Freq: Every day | ORAL | Status: DC
  Administered 2024-02-04: 50 mg via ORAL
  Filled 2024-02-03: qty 2

## 2024-02-03 MED ORDER — BACLOFEN 10 MG PO TABS: 10.0000 mg | ORAL_TABLET | Freq: Three times a day (TID) | ORAL | Status: DC | PRN

## 2024-02-03 MED ORDER — ALBUTEROL SULFATE HFA 108 (90 BASE) MCG/ACT IN AERS: 2.0000 | INHALATION_SPRAY | RESPIRATORY_TRACT | Status: DC | PRN

## 2024-02-03 MED ORDER — ALBUTEROL SULFATE (2.5 MG/3ML) 0.083% IN NEBU: 2.5000 mg | INHALATION_SOLUTION | RESPIRATORY_TRACT | Status: DC | PRN

## 2024-02-03 NOTE — ED Triage Notes (Signed)
 Pt arrived via POV from urgent for concerns of hypoxia. Pts O2 Sats 86% on room air.

## 2024-02-03 NOTE — Plan of Care (Signed)

## 2024-02-03 NOTE — Progress Notes (Signed)
   02/03/24 2110  TOC Brief Assessment  Insurance and Status Reviewed  Patient has primary care physician Yes  Prior level of function: Independent  Social Drivers of Health Review SDOH reviewed needs interventions (Smoking cessation added to AVS)  Readmission risk has been reviewed Yes  Transition of care needs no transition of care needs at this time   No TOC needs at this time. TOC will continue to follow.

## 2024-02-03 NOTE — H&P (Signed)
 [facility] H&P   Patient Demographics:    Ashley Rojas, is a 58 y.o. female  MRN: [id number]   DOB - 01-29-1966  Admit Date - 02/03/2024  Outpatient Primary MD for the patient is Felecia Shelling, Wayland Salinas, MD  Referring MD/NP/PA: Dr Jarold Motto  Patient coming from: home  Chief Complaint  Patient presents with   Shortness of Breath      HPI:    Ashley Rojas  is a 58 y.o. 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 -In ED she was 86%, requiring 3 L oxygen, significant wheezing but improved with steroids, influenza was positive, given her hypoxia [facility] consulted to admit.   Review of systems:      A full 10 point Review of Systems was done, except as stated above, all other Review of Systems were negative.   With Past History of the following :    Past Medical History:  Diagnosis Date   History of ileus 2004   post-op    Kidney stone    stones      Past Surgical History:  Procedure Laterality Date   ABDOMINAL HYSTERECTOMY     COLON SURGERY     FRACTURE SURGERY     R femur      Social History:     Social History   Tobacco Use   Smoking status: Every Day    Current packs/day: 0.50    Types: Cigarettes    Passive exposure: Never   Smokeless tobacco: Never  Substance Use Topics   Alcohol use: Yes    Comment: occasional       Family History :    History reviewed. No pertinent family history.    Home Medications:   Prior to Admission medications    Medication Sig Start Date End Date Taking? Authorizing Provider  albuterol (VENTOLIN HFA) 108 (90 Base) MCG/ACT inhaler Inhale 2 puffs into the lungs every 6 (six) hours as needed for wheezing or shortness of breath.   Yes [provider]  amLODipine (NORVASC) 10 MG tablet Take 10 mg by mouth daily. 05/29/21  Yes [provider]  aspirin EC 325 MG tablet Take 325 mg by mouth daily.   Yes [provider]  atenolol (TENORMIN) 50 MG tablet Take 50 mg by mouth daily. 05/29/21  Yes [provider]  baclofen (LIORESAL) 10 MG tablet Take 1 tablet (10 mg total) by mouth 3 (three) times daily as needed for muscle spasms. 10/19/23  Yes Everlena Cooper,  Adam R, DO  buPROPion (WELLBUTRIN SR) 150 MG 12 hr tablet Take 150 mg by mouth 2 (two) times daily.   Yes [provider]  cholecalciferol (VITAMIN D3) 25 MCG (1000 UNIT) tablet Take 1,000 Units by mouth daily.   Yes [provider]  DULoxetine (CYMBALTA) 30 MG capsule Take 1 capsule (30 mg total) by mouth daily. Take with 60mg  capsule. 09/10/23  Yes Jaffe, Adam R, DO  DULoxetine (CYMBALTA) 60 MG capsule Take 1 capsule (60 mg total) by mouth daily. Take with 30mg  capsule. 09/10/23  Yes Jaffe, Adam R, DO  gabapentin (NEURONTIN) 600 MG tablet Take 1 tablet (600 mg total) by mouth 4 (four) times daily. 09/10/23  Yes Jaffe, Adam R, DO     Allergies:    No Known Allergies   Physical Exam:   Vitals  Blood pressure 107/67, pulse 88, resp. rate 19, height 5\' 4"  (1.626 m), weight 63.2 kg, SpO2 92%.   1. General Developed female lying in bed in NAD,    2. Normal affect and insight, Not Suicidal or Homicidal, Awake Alert, Oriented X 3.  3. No F.N deficits, ALL C.Nerves Intact, Strength 5/5 all 4 extremities, Sensation intact all 4 extremities, Plantars down going.  4. Ears and Eyes appear Normal, Conjunctivae clear, PERRLA. Moist Oral Mucosa.  5. Supple Neck, No JVD, No cervical lymphadenopathy appriciated, No  Carotid Bruits.  6. Symmetrical Chest wall movement, diminished air entry bilaterally with scattered wheezing  7. RRR, No Gallops, Rubs or Murmurs, No Parasternal Heave.  8. Positive Bowel Sounds, Abdomen Soft, No tenderness, No organomegaly appriciated,No rebound -guarding or rigidity.  9.  No Cyanosis, Normal Skin Turgor, No Skin Rash or Bruise.  10. Good muscle tone,  joints appear normal , no effusions, Normal ROM.     Data Review:    CBC Recent Labs  Lab 02/03/24 1350  WBC 5.8  HGB 12.7  HCT 39.9  PLT 242  MCV 89.3  MCH 28.4  MCHC 31.8  RDW 15.9*  LYMPHSABS 1.5  MONOABS 0.7  EOSABS 0.0  BASOSABS 0.0   ------------------------------------------------------------------------------------------------------------------  Chemistries  Recent Labs  Lab 02/03/24 1350  NA 134*  K 3.8  CL 98  CO2 23  GLUCOSE 101*  BUN 11  CREATININE 0.74  CALCIUM 8.6*  AST 17  ALT 11  ALKPHOS 70  BILITOT 0.3   ------------------------------------------------------------------------------------------------------------------ estimated creatinine clearance is 65.4 mL/min (by C-G formula based on SCr of 0.74 mg/dL). ------------------------------------------------------------------------------------------------------------------ No results for input(s): "TSH", "T4TOTAL", "T3FREE", "THYROIDAB" in the last 72 hours.  Invalid input(s): "FREET3"  Coagulation profile No results for input(s): "INR", "PROTIME" in the last 168 hours. ------------------------------------------------------------------------------------------------------------------- No results for input(s): "DDIMER" in the last 72 hours. -------------------------------------------------------------------------------------------------------------------  Cardiac Enzymes No results for input(s): "CKMB", "TROPONINI", "MYOGLOBIN" in the last 168 hours.  Invalid input(s):  "CK" ------------------------------------------------------------------------------------------------------------------    Component Value Date/Time   BNP 83.0 02/03/2024 1350     ---------------------------------------------------------------------------------------------------------------  Urinalysis    Component Value Date/Time   COLORURINE COLORLESS (A) 02/26/2023 1859   APPEARANCEUR CLEAR 02/26/2023 1859   LABSPEC 1.002 (L) 02/26/2023 1859   PHURINE 5.0 02/26/2023 1859   GLUCOSEU NEGATIVE 02/26/2023 1859   HGBUR SMALL (A) 02/26/2023 1859   BILIRUBINUR NEGATIVE 02/26/2023 1859   KETONESUR NEGATIVE 02/26/2023 1859   PROTEINUR NEGATIVE 02/26/2023 1859   UROBILINOGEN 0.2 08/05/2014 1215   NITRITE NEGATIVE 02/26/2023 1859   LEUKOCYTESUR NEGATIVE 02/26/2023 1859    ----------------------------------------------------------------------------------------------------------------   Imaging Results:    No  results found.  EKG:  PR interval 170 ms QRS duration 78 ms QT/QTcB 344/413 ms P-R-T axes 66 70 84 Normal sinus rhythm Septal infarct , age undetermined Abnormal ECG When compared with ECG of 26-Feb-2023 15:27, PREVIOUS ECG IS PRESENT     Assessment and plan    Principal Problem:   Acute respiratory failure with hypoxia (HCC) Active Problems:   Influenza A   Essential hypertension   Tobacco abuse    Acute respiratory failure with hypoxia Influenza A infection COPD exacerbation -Patient 86% on room air, requiring 3 L nasal cannula, reports dyspnea, mildly tachypneic and dyspneic on presentation much improved with steroids and nebulizer treatment. -He was encouraged use incentive spirometry, flutter valve -On 3 L oxygen, wean as tolerated -Continue with Tamiflu -Continue with scheduled DuoNebs and as needed albuterol  Hypertension -Continue with home medications, will add as needed hydralazine  History of CVA -Continue with aspirin  Tobacco  abuse -She was counseled, will start on nicotine patch  Alcohol abuse -She drinks at least 3 nights a week at least 3 beers, will keep on CIWA protocol  Neuropathy -Continue with gabapentin and Cymbalta   DVT Prophylaxis Heparin   AM Labs Ordered, also please review Full Orders  Family Communication: Admission, patients condition and plan of care including tests being ordered have been discussed with the patient  who indicate understanding and agree with the plan and Code Status.  Code Status Full  Likely DC to  home  Consults called: none    Admission status: observation    Time spent in minutes : 60 minutes   Huey Bienenstock M.D on 02/03/2024 at 3:18 PM   [facility] - Office  [phone]

## 2024-02-03 NOTE — ED Provider Notes (Signed)
 [place] EMERGENCY DEPARTMENT AT [facility] Provider Note   CSN: [id number] Arrival date & time: 02/03/24  1301     History {Add pertinent medical, surgical, social history, OB history to HPI:1} Chief Complaint  Patient presents with   Shortness of Breath    England Greb is a 58 y.o. female.  58 year old female who presents to the emergency department with flulike symptoms.  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  Several days ago she started experiencing some congestion and productive cough.  Also is having some mild shortness of breath.  Reports chills as well.  Went to urgent care and was found to be hypoxic with sats of 86%.  Was given a DuoNeb and brought to the emergency department.  Smokes marijuana and cigarettes but does not carry a formal diagnosis of COPD.  Not on home oxygen.       Home Medications Prior to Admission medications   Medication Sig Start Date End Date Taking? Authorizing Provider  amLODipine (NORVASC) 10 MG tablet Take 10 mg by mouth daily. 05/29/21  Yes [provider]  aspirin 81 MG chewable tablet Chew 162 mg by mouth once.    [provider]  atenolol (TENORMIN) 50 MG tablet Take 50 mg by mouth daily. 05/29/21   [provider]  baclofen (LIORESAL) 10 MG tablet Take 1 tablet (10 mg total) by mouth 3 (three) times daily as needed for muscle spasms. 10/19/23   Drema Dallas, DO  DULoxetine (CYMBALTA) 30 MG capsule Take 1 capsule (30 mg total) by mouth daily. Take with 60mg  capsule. 09/10/23   Drema Dallas, DO  DULoxetine (CYMBALTA) 60 MG capsule Take 1 capsule (60 mg total) by mouth daily. Take with 30mg  capsule. 09/10/23   Everlena Cooper, Adam R, DO  gabapentin (NEURONTIN) 600 MG tablet Take 1 tablet (600 mg total) by mouth 4 (four) times daily. 09/10/23   Drema Dallas, DO      Allergies    Patient has no known allergies.    Review of Systems   Review of  Systems  Physical Exam Updated Vital Signs Ht 5\' 4"  (1.626 m)   Wt 63.2 kg   BMI 23.92 kg/m  Physical Exam Vitals and nursing note reviewed.  Constitutional:      General: She is not in acute distress.    Appearance: She is well-developed.  HENT:     Head: Normocephalic and atraumatic.     Right Ear: External ear normal.     Left Ear: External ear normal.     Nose: Nose normal.  Eyes:     Extraocular Movements: Extraocular movements intact.     Conjunctiva/sclera: Conjunctivae normal.     Pupils: Pupils are equal, round, and reactive to light.  Cardiovascular:     Rate and Rhythm: Normal rate and regular rhythm.     Heart sounds: No murmur heard. Pulmonary:     Effort: Pulmonary effort is normal. No respiratory distress.     Breath sounds: Wheezing present.  Musculoskeletal:     Cervical back: Normal range of motion and neck supple.     Right lower leg: No edema.     Left lower leg: No edema.  Skin:    General: Skin is warm and dry.  Neurological:     Mental Status: She is alert and oriented to person, place, and time. Mental status is at baseline.  Psychiatric:  Mood and Affect: Mood normal.     ED Results / Procedures / Treatments   Labs (all labs ordered are listed, but only abnormal results are displayed) Labs Reviewed  RESP PANEL BY RT-PCR (RSV, FLU A&B, COVID)  [id number]  CBC WITH DIFFERENTIAL/PLATELET  COMPREHENSIVE METABOLIC PANEL  BRAIN NATRIURETIC PEPTIDE  TROPONIN I (HIGH SENSITIVITY)    EKG None  Radiology No results found.  Procedures Procedures  {Document cardiac monitor, telemetry assessment procedure when appropriate:1}  Medications Ordered in ED Medications  albuterol (VENTOLIN HFA) 108 (90 Base) MCG/ACT inhaler 2 puff (has no administration in time range)    ED Course/ Medical Decision Making/ A&P   {   Click here for ABCD2, HEART and other calculatorsREFRESH Note before signing :1}                              Medical  Decision Making Amount and/or Complexity of Data Reviewed Labs: ordered. Radiology: ordered.  Risk Prescription drug management.   ***  {Document critical care time when appropriate:1} {Document review of labs and clinical decision tools ie heart score, Chads2Vasc2 etc:1}  {Document your independent review of radiology images, and any outside records:1} {Document your discussion with family members, caretakers, and with consultants:1} {Document social determinants of health affecting pt's care:1} {Document your decision making why or why not admission, treatments were needed:1} Final Clinical Impression(s) / ED Diagnoses Final diagnoses:  None    Rx / DC Orders ED Discharge Orders     None

## 2024-02-04 DIAGNOSIS — J9601 Acute respiratory failure with hypoxia: Secondary | ICD-10-CM | POA: Diagnosis not present

## 2024-02-04 LAB — CBC
HCT: 40.6 % (ref 36.0–46.0)
Hemoglobin: 12.8 g/dL (ref 12.0–15.0)
MCH: 28.3 pg (ref 26.0–34.0)
MCHC: 31.5 g/dL (ref 30.0–36.0)
MCV: 89.6 fL (ref 80.0–100.0)
Platelets: 270 10*3/uL (ref 150–400)
RBC: 4.53 MIL/uL (ref 3.87–5.11)
RDW: 15.7 % — ABNORMAL HIGH (ref 11.5–15.5)
WBC: 5.3 10*3/uL (ref 4.0–10.5)
nRBC: 0 % (ref 0.0–0.2)

## 2024-02-04 LAB — BASIC METABOLIC PANEL
Anion gap: 9 (ref 5–15)
BUN: 15 mg/dL (ref 6–20)
CO2: 27 mmol/L (ref 22–32)
Calcium: 8.9 mg/dL (ref 8.9–10.3)
Chloride: 103 mmol/L (ref 98–111)
Creatinine, Ser: 0.55 mg/dL (ref 0.44–1.00)
GFR, Estimated: >60 mL/min (ref >60)
Glucose, Bld: 150 mg/dL — ABNORMAL HIGH (ref 70–99)
Potassium: 4.7 mmol/L (ref 3.5–5.1)
Sodium: 139 mmol/L (ref 135–145)

## 2024-02-04 LAB — HIV ANTIBODY (ROUTINE TESTING W REFLEX): HIV Screen 4th Generation wRfx: NONREACTIVE

## 2024-02-04 MED ORDER — ACETAMINOPHEN 325 MG PO TABS: 650.0000 mg | ORAL_TABLET | Freq: Four times a day (QID) | ORAL | 0 refills | Status: AC | PRN

## 2024-02-04 MED ORDER — HYDROCOD POLI-CHLORPHE POLI ER 10-8 MG/5ML PO SUER
5.0000 mL | Freq: Two times a day (BID) | ORAL | Status: DC
  Filled 2024-02-04: qty 5

## 2024-02-04 MED ORDER — OSELTAMIVIR PHOSPHATE 75 MG PO CAPS: 75.0000 mg | ORAL_CAPSULE | Freq: Two times a day (BID) | ORAL | 0 refills | Status: AC

## 2024-02-04 MED ORDER — METHYLPREDNISOLONE 4 MG PO TBPK: ORAL_TABLET | ORAL | 0 refills | Status: AC

## 2024-02-04 MED ORDER — BENZONATATE 100 MG PO CAPS: 100.0000 mg | ORAL_CAPSULE | Freq: Three times a day (TID) | ORAL | 0 refills | Status: AC | PRN

## 2024-02-04 MED ORDER — HYDROCOD POLI-CHLORPHE POLI ER 10-8 MG/5ML PO SUER: 5.0000 mL | Freq: Two times a day (BID) | ORAL | Status: DC | PRN

## 2024-02-04 MED ORDER — NICOTINE 21 MG/24HR TD PT24: 21.0000 mg | MEDICATED_PATCH | Freq: Every day | TRANSDERMAL | 0 refills | Status: AC

## 2024-02-04 MED ORDER — ALBUTEROL SULFATE HFA 108 (90 BASE) MCG/ACT IN AERS: 2.0000 | INHALATION_SPRAY | Freq: Four times a day (QID) | RESPIRATORY_TRACT | 2 refills | Status: AC | PRN

## 2024-02-04 MED ORDER — GUAIFENESIN-DM 100-10 MG/5ML PO SYRP: 5.0000 mL | ORAL_SOLUTION | ORAL | 0 refills | Status: AC | PRN

## 2024-02-04 MED ORDER — DM-GUAIFENESIN ER 30-600 MG PO TB12
1.0000 | ORAL_TABLET | Freq: Two times a day (BID) | ORAL | Status: DC
  Administered 2024-02-04 (×2): 1 via ORAL
  Filled 2024-02-04 (×2): qty 1

## 2024-02-04 MED ORDER — PANTOPRAZOLE SODIUM 40 MG PO TBEC: 40.0000 mg | DELAYED_RELEASE_TABLET | Freq: Every day | ORAL | 0 refills | Status: AC

## 2024-02-04 NOTE — Plan of Care (Signed)

## 2024-02-04 NOTE — Discharge Summary (Signed)
 Physician Discharge Summary   Patient: Ashley Rojas MRN: [id number] DOB: [birth year]  Admit date:     02/03/2024  Discharge date: 02/04/24  Discharge Physician: Kendell Bane   PCP: Benetta Spar, MD   Recommendations at discharge:   Follow-up with PCP in 2-4 weeks  Discharge Diagnoses: Principal Problem:   Acute respiratory failure with hypoxia (HCC) Active Problems:   Influenza A   Essential hypertension   Tobacco abuse   Ashley Rojas  is a 58 y.o. 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 -In ED she was 86%, requiring 3 L oxygen, significant wheezing but improved with steroids, influenza was positive, given her hypoxia    Acute respiratory failure with hypoxia Influenza A infection COPD exacerbation -POA: patient 86% on room air, requiring 3 L nasal cannula, reports dyspnea, mildly tachypneic and dyspneic on presentation much improved with steroids and nebulizer treatment. -He was encouraged use incentive spirometry, flutter valve  -Patient was successfully weaned off, walked, maintain her O2 sat greater than 96% -   -Sinew Tamiflu for total of 5 days, 4 more days -Change IV steroids to Medrol Dosepak taper -Robitussin as needed, Tessalon Perles -Albuterol inhaler 4 hours as needed    Hypertension -Continue with home medications,    History of CVA -Continue with aspirin   Tobacco abuse -She was counseled, will start on nicotine patch   Alcohol abuse -She drinks at least 3 nights a week at least 3 beers,  No signs of withdrawal    Neuropathy -Continue with gabapentin and Cymbalta     Disposition: Home Diet recommendation:  Discharge Diet Orders (From admission, onward)     Start     Ordered   02/04/24 0000  Diet - low sodium heart healthy        02/04/24 1201           Regular diet DISCHARGE MEDICATION: Allergies as of 02/04/2024   No Known Allergies      Medication List     TAKE these medications    acetaminophen 325 MG tablet Commonly known as: TYLENOL Take 2 tablets (650 mg total) by mouth every 6 (six) hours as needed for up to 10 days for mild pain (pain score 1-3) (or Fever >/= 101).   albuterol 108 (90 Base) MCG/ACT inhaler Commonly known as: VENTOLIN HFA Inhale 2 puffs into the lungs every 6 (six) hours as needed for wheezing or shortness of breath. What changed: Another medication with the same name was added. Make sure you understand how and when to take each.   albuterol 108 (90 Base) MCG/ACT inhaler Commonly known as: VENTOLIN HFA Inhale 2 puffs into the lungs every 6 (six) hours as needed for wheezing or shortness of breath. What changed: You were already taking a medication with the same name, and this prescription was added. Make sure you understand how and when to take each.   amLODipine 10 MG tablet Commonly known as: NORVASC Take 10 mg by mouth daily.   aspirin EC 325 MG tablet Take 325 mg by mouth daily.   atenolol 50 MG  tablet Commonly known as: TENORMIN Take 50 mg by mouth daily.   baclofen 10 MG tablet Commonly known as: LIORESAL Take 1 tablet (10 mg total) by mouth 3 (three) times daily as needed for muscle spasms.   benzonatate 100 MG capsule Commonly known as: Tessalon Perles Take 1 capsule (100 mg total) by mouth 3 (three) times daily as needed for cough.   buPROPion 150 MG 12 hr tablet Commonly known as: WELLBUTRIN SR Take 150 mg by mouth 2 (two) times daily.   cholecalciferol 25 MCG (1000 UNIT) tablet Commonly known as: VITAMIN D3 Take 1,000  Units by mouth daily.   DULoxetine 30 MG capsule Commonly known as: CYMBALTA Take 1 capsule (30 mg total) by mouth daily. Take with 60mg  capsule. What changed: Another medication with the same name was removed. Continue taking this medication, and follow the directions you see here.   gabapentin 600 MG tablet Commonly known as: NEURONTIN Take 1 tablet (600 mg total) by mouth 4 (four) times daily.   guaiFENesin-dextromethorphan 100-10 MG/5ML syrup Commonly known as: ROBITUSSIN DM Take 5 mLs by mouth every 4 (four) hours as needed for cough.   methylPREDNISolone 4 MG Tbpk tablet Commonly known as: MEDROL DOSEPAK Medrol Dosepak take as instructed   nicotine 21 mg/24hr patch Commonly known as: NICODERM CQ - dosed in mg/24 hours Place 1 patch (21 mg total) onto the skin daily. Start taking on: February 05, 2024   oseltamivir 75 MG capsule Commonly known as: TAMIFLU Take 1 capsule (75 mg total) by mouth 2 (two) times daily for 4 days.   pantoprazole 40 MG tablet Commonly known as: PROTONIX Take 1 tablet (40 mg total) by mouth daily for 10 days. Start taking on: February 05, 2024        Discharge Exam: Ceasar Mons Weights   02/03/24 1318  Weight: 63.2 kg        General:  AAO x 3,  cooperative, no distress;   HEENT:  Normocephalic, PERRL, otherwise with in Normal limits   Neuro:  CNII-XII intact. , normal motor and sensation, reflexes intact   Lungs:   Breath sounds diffusely, diffuse rhonchi, wheezing much improved per patient negative any crackles  Cardio:    S1/S2, RRR, No murmure, No Rubs or Gallops   Abdomen:  Soft, non-tender, bowel sounds active all four quadrants, no guarding or peritoneal signs.  Muscular  skeletal:  Limited exam -global generalized weaknesses - in bed, able to move all 4 extremities,   2+ pulses,  symmetric, No pitting edema  Skin:  Dry, warm to touch, negative for any Rashes,  Wounds: Please see nursing documentation          Condition at  discharge: good  The results of significant diagnostics from this hospitalization (including imaging, microbiology, ancillary and laboratory) are listed below for reference.   Imaging Studies: DG Chest 2 View Result Date: 02/03/2024 CLINICAL DATA:  Shortness of breath. EXAM: CHEST - 2 VIEW COMPARISON:  November 12, 2023. FINDINGS: The heart size and mediastinal contours are within normal limits. Both lungs are clear. The visualized skeletal structures are unremarkable. IMPRESSION: No active cardiopulmonary disease. Electronically Signed   By: Lupita Raider M.D.   On: 02/03/2024 16:58    Microbiology: Results for orders placed or performed during the hospital encounter of 02/03/24  Resp panel by RT-PCR (RSV, Flu A&B, Covid) Anterior Nasal Swab     Status: Abnormal   Collection Time: 02/03/24  1:20 PM   Specimen: Anterior Nasal  Swab  Result Value Ref Range Status   SARS Coronavirus 2 by RT PCR NEGATIVE NEGATIVE Final    Comment: (NOTE) SARS-CoV-2 target nucleic acids are NOT DETECTED.  The SARS-CoV-2 RNA is generally detectable in upper respiratory specimens during the acute phase of infection. The lowest concentration of SARS-CoV-2 viral copies this assay can detect is 138 copies/mL. A negative result does not preclude SARS-Cov-2 infection and should not be used as the sole basis for treatment or other patient management decisions. A negative result may occur with  improper specimen collection/handling, submission of specimen other than nasopharyngeal swab, presence of viral mutation(s) within the areas targeted by this assay, and inadequate number of viral copies(<138 copies/mL). A negative result must be combined with clinical observations, patient history, and epidemiological information. The expected result is Negative.  Fact Sheet for Patients:  BloggerCourse.com  Fact Sheet for Healthcare Providers:  SeriousBroker.it  This  test is no t yet approved or cleared by the [place] FDA and  has been authorized for detection and/or diagnosis of SARS-CoV-2 by FDA under an Emergency Use Authorization (EUA). This EUA will remain  in effect (meaning this test can be used) for the duration of the COVID-19 declaration under Section 564(b)(1) of the Act, 21 U.S.C.section 360bbb-3(b)(1), unless the authorization is terminated  or revoked sooner.       Influenza A by PCR POSITIVE (A) NEGATIVE Final   Influenza B by PCR NEGATIVE NEGATIVE Final    Comment: (NOTE) The Xpert Xpress SARS-CoV-2/FLU/RSV plus assay is intended as an aid in the diagnosis of influenza from Nasopharyngeal swab specimens and should not be used as a sole basis for treatment. Nasal washings and aspirates are unacceptable for Xpert Xpress SARS-CoV-2/FLU/RSV testing.  Fact Sheet for Patients: BloggerCourse.com  Fact Sheet for Healthcare Providers: SeriousBroker.it  This test is not yet approved or cleared by the [place] FDA and has been authorized for detection and/or diagnosis of SARS-CoV-2 by FDA under an Emergency Use Authorization (EUA). This EUA will remain in effect (meaning this test can be used) for the duration of the COVID-19 declaration under Section 564(b)(1) of the Act, 21 U.S.C. section 360bbb-3(b)(1), unless the authorization is terminated or revoked.     Resp Syncytial Virus by PCR NEGATIVE NEGATIVE Final    Comment: (NOTE) Fact Sheet for Patients: BloggerCourse.com  Fact Sheet for Healthcare Providers: SeriousBroker.it  This test is not yet approved or cleared by the [place] FDA and has been authorized for detection and/or diagnosis of SARS-CoV-2 by FDA under an Emergency Use Authorization (EUA). This EUA will remain in effect (meaning this test can be used) for the duration of the COVID-19 declaration under  Section 564(b)(1) of the Act, 21 U.S.C. section 360bbb-3(b)(1), unless the authorization is terminated or revoked.  Performed at [facility], [address] [place], [place] [address]     Labs: CBC: Recent Labs  Lab 02/03/24 1350 02/04/24 0414  WBC 5.8 5.3  NEUTROABS 3.5  --   HGB 12.7 12.8  HCT 39.9 40.6  MCV 89.3 89.6  PLT 242 270   Basic Metabolic Panel: Recent Labs  Lab 02/03/24 1350 02/04/24 0414  NA 134* 139  K 3.8 4.7  CL 98 103  CO2 23 27  GLUCOSE 101* 150*  BUN 11 15  CREATININE 0.74 0.55  CALCIUM 8.6* 8.9   Liver Function Tests: Recent Labs  Lab 02/03/24 1350  AST 17  ALT 11  ALKPHOS 70  BILITOT 0.3  PROT 7.2  ALBUMIN 3.4*   CBG: No results for input(s): "GLUCAP" in the last 168 hours.  Discharge time spent: greater than 30 minutes.  Signed: Kendell Bane, MD [facility] 02/04/2024

## 2024-03-29 ENCOUNTER — Telehealth: Payer: Self-pay | Admitting: Neurology

## 2024-03-29 ENCOUNTER — Other Ambulatory Visit: Payer: Self-pay | Admitting: Neurology

## 2024-03-29 MED ORDER — GABAPENTIN 600 MG PO TABS: 600.0000 mg | ORAL_TABLET | Freq: Four times a day (QID) | ORAL | 0 refills | Status: DC

## 2024-03-29 MED ORDER — DULOXETINE HCL 30 MG PO CPEP: 30.0000 mg | ORAL_CAPSULE | Freq: Every day | ORAL | 0 refills | Status: DC

## 2024-03-29 NOTE — Telephone Encounter (Signed)
 Done

## 2024-03-29 NOTE — Telephone Encounter (Signed)
 Pt called and LM with AN. She needs refills on her gabapentin  and Duloxetine . She is out of these medications  [facility] [place]

## 2024-04-14 NOTE — Progress Notes (Unsigned)
 Virtual Visit via Video Note  Consent was obtained for video visit:  Yes.   Answered questions that patient had about telehealth interaction:  Yes.   I discussed the limitations, risks, security and privacy concerns of performing an evaluation and management service by telemedicine. I also discussed with the patient that there may be a patient responsible charge related to this service. The patient expressed understanding and agreed to proceed.  Pt location: Home Physician Location: office Name of referring provider:  Fanta, Tesfaye Demissie* I connected with Ashley Rojas at patients initiation/request on 04/18/2024 at 10:10 AM EDT by video enabled telemedicine application and verified that I am speaking with the correct person using two identifiers. 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  UPDATE: Thalamic Pain Syndrome: Taking duloxetine  90mg  daily and gabapentin  600mg  every 4 hours (she takes it 1200mg  in morning and 1200mg  at night). Pain is manageable.   Left sided Neck pain/radiculopathy: Prescribed baclofen  and referred to PT for neck pain.  She never received a call to schedule an appointment.    Still has a burning on occasion.  Arm may still jump a bith.  Current NSAIDS/analgesics:   ASA 325mg  daily Current triptans:  CONTRAINDICATED Current ergotamine:  none Current anti-emetic:  none Current muscle relaxants:  baclofen  10mg  TID PRN Current Antihypertensive medications:  atenolol  50mg  BID, amlodipine  10mg  QD Current Antidepressant medications:  duloxetine  90mg  daily (chronic pain) Current Anticonvulsant medications:  gabapentin  600mg  QID Current anti-CGRP:  none Current Vitamins/Herbal/Supplements:  none Current Antihistamines/Decongestants:  none  HISTORY: Right Thalamic Pain Syndrome: She had a presumed DWI-negative right thalamic stroke in June 2020.  She presented to the ED with acute onset of left sided upper and lower extremity numbness.  No weakness, speech disturbance, facial droop.  MRI of brain was negative for acute intracranial abnormality or significant white matter disease.  MRA of head showed no LVO, significant stenosis or aneurysm.  Within a couple of days she developed burning pain involving the left leg but not in a dermatomal distribution.  Sometimes she experiences painful spasms on the left side of her torso.  No pain in the arm but sometimes it "jumps". More recently, she has been experiencing left sided neck pain.  If she turns her head to the left, she feels a shooting pain up the neck and back of the head.  Sometimes her left arm will "jerk".   Headaches: Longstanding history of headaches that became worse after stroke.  Either side, sharp.  Sometimes nausea, no photophobia or phonophobia.  Typically lasts 45 minutes with Tylenol .  They occur twice a week.    Past NSAIDS/analgesics:  hydrocodone  Past abortive triptans:  CONTRAINDICATED Past abortive ergotamine:  none Past muscle relaxants:  Robaxin  Past anti-emetic:  none Past antihypertensive medications:  none Past antidepressant medications:  none Past anticonvulsant  medications:  none Past anti-CGRP:  none Past vitamins/Herbal/Supplements:  none Past antihistamines/decongestants:   none Other past therapies:  none  Past Medical History: Past Medical History:  Diagnosis Date   History of ileus 2004   post-op    Hypertension    Kidney stone    stones    Medications: Outpatient Encounter Medications as of 04/18/2024  Medication Sig   albuterol  (VENTOLIN  HFA) 108 (90 Base) MCG/ACT inhaler Inhale 2 puffs into the lungs every 6 (six) hours as needed for wheezing or shortness of breath.   albuterol  (VENTOLIN  HFA) 108 (90 Base) MCG/ACT inhaler Inhale 2 puffs into the lungs every 6 (six) hours as needed for wheezing or shortness of breath.   amLODipine  (NORVASC ) 10 MG tablet Take 10 mg by mouth daily.   aspirin  EC 325 MG tablet Take 325 mg by mouth daily.   atenolol  (TENORMIN ) 50 MG tablet Take 50 mg by mouth daily.   cholecalciferol (VITAMIN D3) 25 MCG (1000 UNIT) tablet Take 1,000 Units by mouth daily.   DULoxetine  (CYMBALTA ) 60 MG capsule Take 1 capsule (60 mg total) by mouth daily. Take with 30mg  capsule.   [DISCONTINUED] baclofen  (LIORESAL ) 10 MG tablet Take 1 tablet (10 mg total) by mouth 3 (three) times daily as needed for muscle spasms.   [DISCONTINUED] DULoxetine  (CYMBALTA ) 30 MG capsule Take 1 capsule (30 mg total) by mouth daily. Take with 60mg  capsule.   [DISCONTINUED] gabapentin  (NEURONTIN ) 600 MG tablet Take 1 tablet (600 mg total) by mouth 4 (four) times daily.   baclofen  (LIORESAL ) 10 MG tablet Take 1 tablet (10 mg total) by mouth 3 (three) times daily as needed for muscle spasms.   benzonatate  (TESSALON  PERLES) 100 MG capsule Take 1 capsule (100 mg total) by mouth 3 (three) times daily as needed for cough. (Patient not taking: Reported on 04/18/2024)   buPROPion (WELLBUTRIN SR) 150 MG 12 hr tablet Take 150 mg by mouth 2 (two) times daily. (Patient not taking: Reported on 04/18/2024)   DULoxetine  (CYMBALTA ) 30 MG capsule Take 1 capsule (30 mg total) by mouth daily. Take with 60mg  capsule.   gabapentin  (NEURONTIN ) 600 MG tablet Take 2 tablets (1,200 mg total)  by mouth in the morning and at bedtime.   guaiFENesin -dextromethorphan  (ROBITUSSIN DM) 100-10 MG/5ML syrup Take 5 mLs by mouth every 4 (four) hours as needed for cough. (Patient not taking: Reported on 04/18/2024)   methylPREDNISolone  (MEDROL  DOSEPAK) 4 MG TBPK tablet Medrol  Dosepak take as instructed (Patient not taking: Reported on 04/18/2024)   nicotine  (NICODERM CQ  - DOSED IN MG/24 HOURS) 21 mg/24hr patch Place 1 patch (21 mg total) onto the skin daily. (Patient not taking: Reported on 04/18/2024)   pantoprazole  (PROTONIX ) 40 MG tablet Take 1 tablet (40 mg total) by mouth daily for 10 days.   No facility-administered encounter medications on file as of 04/18/2024.    Allergies: No Known Allergies  Family History: No family history on file.  Observations/Objective:   No acute distress.  Alert and oriented.  Speech fluent and not dysarthric.  Language intact.     Follow Up Instructions:    -I discussed the assessment and treatment plan with the patient. The patient was provided an opportunity to ask questions and all were answered. The patient agreed with the plan and demonstrated an understanding of the instructions.   The patient was advised to call back or seek an in-person evaluation if the symptoms worsen or if the condition fails to improve as anticipated.  Nathaniel Bald, DO   CC: Clary Crown, MD

## 2024-04-18 ENCOUNTER — Encounter: Payer: Self-pay | Admitting: Neurology

## 2024-04-18 ENCOUNTER — Telehealth: Payer: Medicaid Other | Admitting: Neurology

## 2024-04-18 DIAGNOSIS — M542 Cervicalgia: Secondary | ICD-10-CM | POA: Diagnosis not present

## 2024-04-18 MED ORDER — GABAPENTIN 600 MG PO TABS: 1200.0000 mg | ORAL_TABLET | Freq: Two times a day (BID) | ORAL | 5 refills | Status: DC

## 2024-04-18 MED ORDER — DULOXETINE HCL 60 MG PO CPEP
60.0000 mg | ORAL_CAPSULE | Freq: Every day | ORAL | 5 refills | Status: DC
Start: 2024-04-18 — End: 2024-08-11

## 2024-04-18 MED ORDER — BACLOFEN 10 MG PO TABS: 10.0000 mg | ORAL_TABLET | Freq: Three times a day (TID) | ORAL | 5 refills | Status: AC | PRN

## 2024-04-18 MED ORDER — DULOXETINE HCL 30 MG PO CPEP: 30.0000 mg | ORAL_CAPSULE | Freq: Every day | ORAL | 5 refills | Status: DC

## 2024-05-10 ENCOUNTER — Other Ambulatory Visit: Payer: Self-pay | Admitting: Neurology

## 2024-05-10 ENCOUNTER — Other Ambulatory Visit (HOSPITAL_COMMUNITY)
Admission: RE | Admit: 2024-05-10 | Discharge: 2024-05-10 | Disposition: A | Discharge status: Home / Self Care | Source: Ambulatory Visit | Attending: Internal Medicine | Admitting: Internal Medicine

## 2024-05-10 DIAGNOSIS — Z0001 Encounter for general adult medical examination with abnormal findings: Secondary | ICD-10-CM | POA: Diagnosis present

## 2024-05-10 DIAGNOSIS — E785 Hyperlipidemia, unspecified: Secondary | ICD-10-CM | POA: Insufficient documentation

## 2024-05-10 DIAGNOSIS — I10 Essential (primary) hypertension: Secondary | ICD-10-CM | POA: Diagnosis not present

## 2024-05-10 LAB — CBC WITH DIFFERENTIAL/PLATELET
Abs Immature Granulocytes: 0.04 10*3/uL (ref 0.00–0.07)
Basophils Absolute: 0 10*3/uL (ref 0.0–0.1)
Basophils Relative: 0 %
Eosinophils Absolute: 0.1 10*3/uL (ref 0.0–0.5)
Eosinophils Relative: 1 %
HCT: 36.5 % (ref 36.0–46.0)
Hemoglobin: 11.1 g/dL — ABNORMAL LOW (ref 12.0–15.0)
Immature Granulocytes: 1 %
Lymphocytes Relative: 29 %
Lymphs Abs: 2.4 10*3/uL (ref 0.7–4.0)
MCH: 25.5 pg — ABNORMAL LOW (ref 26.0–34.0)
MCHC: 30.4 g/dL (ref 30.0–36.0)
MCV: 83.7 fL (ref 80.0–100.0)
Monocytes Absolute: 0.7 10*3/uL (ref 0.1–1.0)
Monocytes Relative: 8 %
Neutro Abs: 5.2 10*3/uL (ref 1.7–7.7)
Neutrophils Relative %: 61 %
Platelets: 327 10*3/uL (ref 150–400)
RBC: 4.36 MIL/uL (ref 3.87–5.11)
RDW: 17.6 % — ABNORMAL HIGH (ref 11.5–15.5)
WBC: 8.5 10*3/uL (ref 4.0–10.5)
nRBC: 0 % (ref 0.0–0.2)

## 2024-05-10 LAB — BASIC METABOLIC PANEL WITH GFR
Anion gap: 10 (ref 5–15)
BUN: 6 mg/dL (ref 6–20)
CO2: 22 mmol/L (ref 22–32)
Calcium: 8.9 mg/dL (ref 8.9–10.3)
Chloride: 107 mmol/L (ref 98–111)
Creatinine, Ser: 0.56 mg/dL (ref 0.44–1.00)
GFR, Estimated: >60 mL/min (ref >60)
Glucose, Bld: 114 mg/dL — ABNORMAL HIGH (ref 70–99)
Potassium: 4 mmol/L (ref 3.5–5.1)
Sodium: 139 mmol/L (ref 135–145)

## 2024-05-10 LAB — HEPATIC FUNCTION PANEL
ALT: 10 U/L (ref 0–44)
AST: 13 U/L — ABNORMAL LOW (ref 15–41)
Albumin: 3.4 g/dL — ABNORMAL LOW (ref 3.5–5.0)
Alkaline Phosphatase: 79 U/L (ref 38–126)
Bilirubin, Direct: <0.1 mg/dL (ref 0.0–0.2)
Total Bilirubin: 0.2 mg/dL (ref 0.0–1.2)
Total Protein: 6.8 g/dL (ref 6.5–8.1)

## 2024-05-10 LAB — LIPID PANEL
Cholesterol: 196 mg/dL (ref 0–200)
HDL: 48 mg/dL (ref >40)
LDL Cholesterol: 115 mg/dL — ABNORMAL HIGH (ref 0–99)
Total CHOL/HDL Ratio: 4.1 ratio
Triglycerides: 166 mg/dL — ABNORMAL HIGH (ref <150)
VLDL: 33 mg/dL (ref 0–40)

## 2024-05-22 ENCOUNTER — Other Ambulatory Visit: Payer: Self-pay

## 2024-05-22 ENCOUNTER — Encounter (HOSPITAL_COMMUNITY): Payer: Self-pay

## 2024-05-22 ENCOUNTER — Ambulatory Visit (HOSPITAL_COMMUNITY): Attending: Neurology

## 2024-05-22 DIAGNOSIS — Z7409 Other reduced mobility: Secondary | ICD-10-CM | POA: Insufficient documentation

## 2024-05-22 DIAGNOSIS — M5382 Other specified dorsopathies, cervical region: Secondary | ICD-10-CM | POA: Diagnosis present

## 2024-05-22 DIAGNOSIS — M542 Cervicalgia: Secondary | ICD-10-CM | POA: Insufficient documentation

## 2024-05-22 NOTE — Therapy (Signed)
 OUTPATIENT PHYSICAL THERAPY CERVICAL EVALUATION   Patient Name: Ashley Rojas MRN: [id number] DOB:1966-01-19, 58 y.o., female Today's Date: 05/22/2024  END OF SESSION:  PT End of Session - 05/22/24 1301     Visit Number 1    Number of Visits 9    Date for PT Re-Evaluation 06/23/24    Authorization Type [employer]    Authorization Time Period No auth until after visit 27 PT/OT/Speech combined    Progress Note Due on Visit 10    PT Start Time 1301    PT Stop Time 1345    PT Time Calculation (min) 44 min    Activity Tolerance Patient tolerated treatment well    Behavior During Therapy WFL for tasks assessed/performed          Past Medical History:  Diagnosis Date   History of ileus 2004   post-op    Hypertension    Kidney stone    stones   Past Surgical History:  Procedure Laterality Date   ABDOMINAL HYSTERECTOMY     COLON SURGERY     FRACTURE SURGERY     R femur   Patient Active Problem List   Diagnosis Date Noted   Acute respiratory failure with hypoxia (HCC) 02/03/2024   Influenza A 02/03/2024   Essential hypertension 02/03/2024   Tobacco abuse 02/03/2024    PCP: Fanta, Tesfaye Desimmie, MD  REFERRING PROVIDER: Skeet Juliene SAUNDERS, DO  REFERRING DIAG: M54.2 (ICD-10-CM) - Cervicalgia  THERAPY DIAG:  Decreased ROM of intervertebral discs of cervical spine  Impaired functional mobility and activity tolerance  Rationale for Evaluation and Treatment: Rehabilitation  ONSET DATE: 6 months- 1 year  SUBJECTIVE:                                                                                                                                                                                                         SUBJECTIVE STATEMENT: Patient and daughter present during session. Daughter contributes to subjective. Patient reports sharp pain when she turns her neck both ways. No inciting incident. Reports numbness/tingling into the hands intermittently  that occurred before neck pain but occurs more now. Reports mild headaches recently although they were migraines before. Has never drove.  Hand dominance: Right  PERTINENT HISTORY:  N/A  PAIN:  Are you having pain? Yes: NPRS scale: 8/10 Pain location: Neck Pain description: Sharp pain with certain motions Aggravating factors: Turning Relieving factors: Rest, Pressure at neck  PRECAUTIONS: None  RED FLAGS: None     WEIGHT BEARING RESTRICTIONS: No  FALLS:  Has patient  fallen in last 6 months? No  OCCUPATION: Caregiver, Full time   PLOF: Independent  PATIENT GOALS: To get rid of pain in neck  NEXT MD VISIT: Sept 2025  OBJECTIVE:  Note: Objective measures were completed at Evaluation unless otherwise noted.  DIAGNOSTIC FINDINGS:    PATIENT SURVEYS:  Neck Disability Index score: 12 / 50 = 24.0 %  COGNITION: Overall cognitive status: Within functional limits for tasks assessed  SENSATION: Light touch: Impaired  Dec light touch sens on L C4-C5 dermatome  POSTURE: rounded shoulders and forward head  PALPATION: Unremarkable in sitting Once prone, familiar neck pain with CPA ~C4/C5  CERVICAL ROM:   Active ROM A/PROM (deg) eval  Flexion 55  Extension 61  Right lateral flexion 36  Left lateral flexion 35  Right rotation 34 * catch feeling  Left rotation 74    (Blank rows = not tested)  *=painful  UPPER EXTREMITY ROM:  Active ROM Right eval Left eval  Shoulder flexion    Shoulder extension    Shoulder abduction    Shoulder adduction    Shoulder extension    Shoulder internal rotation    Shoulder external rotation    Elbow flexion    Elbow extension    Wrist flexion    Wrist extension    Wrist ulnar deviation    Wrist radial deviation    Wrist pronation    Wrist supination     (Blank rows = not tested)  UPPER EXTREMITY MMT:  MMT Right eval Left eval  Shoulder flexion    Shoulder extension    Shoulder abduction    Shoulder adduction     Shoulder extension    Shoulder internal rotation    Shoulder external rotation    Middle trapezius    Lower trapezius    Elbow flexion    Elbow extension    Wrist flexion    Wrist extension    Wrist ulnar deviation    Wrist radial deviation    Wrist pronation    Wrist supination    Grip strength     (Blank rows = not tested)  CERVICAL SPECIAL TESTS:  Spurling's test: Next session  and Distraction test: Positive  FUNCTIONAL TESTS:    TREATMENT DATE:  05/22/24: PT Eval and HEP                                                                                                                                 PATIENT EDUCATION:  Education details: PT evaluation, objective findings, POC, Importance of HEP, Precautions, Clinic policies Person educated: Patient Education method: Explanation and Demonstration Education comprehension: verbalized understanding and returned demonstration  HOME EXERCISE PROGRAM: Access Code: [email or web address] URL: [email or web address] Date: 05/22/2024 Prepared by: Rosaria Powell-Butler  Exercises - Supine Cervical Rotation AROM on Pillow  - 2 x daily - 7 x weekly - 3 sets - 10 reps - 5 hold - Supine Chin Tuck  - 2  x daily - 7 x weekly - 3 sets - 10 reps - 5 hold - Seated Assisted Cervical Rotation with Towel  - 2 x daily - 7 x weekly - 3 sets - 10 reps - 5 hold - Seated Upper Trapezius Stretch  - 2 x daily - 7 x weekly - 3 sets - 10 reps - 30 hold  ASSESSMENT:  CLINICAL IMPRESSION: Patient is a 58 y.o. female who was seen today for physical therapy evaluation and treatment for M54.2 (ICD-10-CM) - Cervicalgia. Patient arrives to PT evaluation with reports of chronic neck pain that increases with rotation mostly. Patient demonstrates decreased cervical ROM, impaired sensation, and increased pain with palpation and active ROM. Patient demonstrates improvements with passive ROM following STM of paraspinal cervical musculature and distraction with  patient in supine. Would suspect facet involvement. Patient will benefit from skilled physical therapy in order to address the above in order to improve function/QOL.    OBJECTIVE IMPAIRMENTS: decreased activity tolerance, decreased ROM, increased fascial restrictions, impaired flexibility, and pain.   ACTIVITY LIMITATIONS: carrying, lifting, sleeping, and caring for others  PARTICIPATION LIMITATIONS: meal prep, cleaning, laundry, and occupation  PERSONAL FACTORS: N/A are also affecting patient's functional outcome.   REHAB POTENTIAL: Good  CLINICAL DECISION MAKING: Stable/uncomplicated  EVALUATION COMPLEXITY: Low   GOALS: Goals reviewed with patient? No  SHORT TERM GOALS: Target date: 06/05/24 Patient will be independent with performance of HEP to demonstrate adequate self management of symptoms.  Baseline:  Goal status: INITIAL  2.   Patient will report at least a 25% improvement with function or pain overall since beginning PT. Baseline:  Goal status: INITIAL   LONG TERM GOALS: Target date: 06/23/24  Patient will improve cervical rotation to at least 70 degrees bilaterally in order to be safe performing functional tasks.  Baseline:  Goal status: INITIAL  2.  Patient will improve cervical extension ROM to at least 70 degrees in order to perform overhead tasks safely.  Baseline:  Goal status: INITIAL  3.  Patient will report daily pain rating of 4/10 or less during ADLs to demonstrate improved activity tolerance.  Baseline:  Goal status: INITIAL  4. Patient will report at least a 50% improvement with function or pain overall since beginning PT. Baseline:  Goal status: INITIAL   PLAN:  PT FREQUENCY: 2x/week  PT DURATION: 6 weeks  PLANNED INTERVENTIONS: 97164- PT Re-evaluation, 97110-Therapeutic exercises, 97530- Therapeutic activity, [id number]- Neuromuscular re-education, 97535- Self Care, 02859- Manual therapy, [id number]- Electrical stimulation (manual), [id number]- Traction  (mechanical), 20560 (1-2 muscles), 20561 (3+ muscles)- Dry Needling, Patient/Family education, Taping, Joint mobilization, Spinal mobilization, Cryotherapy, and Moist heat  PLAN FOR NEXT SESSION: Review HEP and goals, Spurling test, MHP to cervical musculature, UE ROM and MMT testing, Progress cervical mobility activities/stretching   6:47 PM, 05/22/24 Janthony Holleman Powell-Butler, PT, DPT [facility]

## 2024-05-27 ENCOUNTER — Emergency Department (HOSPITAL_COMMUNITY)
Admission: EM | Admit: 2024-05-27 | Discharge: 2024-05-27 | Disposition: A | Discharge status: Home / Self Care | Attending: Emergency Medicine | Admitting: Emergency Medicine

## 2024-05-27 ENCOUNTER — Emergency Department (HOSPITAL_COMMUNITY)

## 2024-05-27 ENCOUNTER — Other Ambulatory Visit: Payer: Self-pay

## 2024-05-27 ENCOUNTER — Encounter (HOSPITAL_COMMUNITY): Payer: Self-pay | Admitting: *Deleted

## 2024-05-27 DIAGNOSIS — Z7982 Long term (current) use of aspirin: Secondary | ICD-10-CM | POA: Diagnosis not present

## 2024-05-27 DIAGNOSIS — R1030 Lower abdominal pain, unspecified: Secondary | ICD-10-CM | POA: Diagnosis present

## 2024-05-27 DIAGNOSIS — Z79899 Other long term (current) drug therapy: Secondary | ICD-10-CM | POA: Insufficient documentation

## 2024-05-27 DIAGNOSIS — E876 Hypokalemia: Secondary | ICD-10-CM | POA: Diagnosis not present

## 2024-05-27 LAB — COMPREHENSIVE METABOLIC PANEL WITH GFR
ALT: 14 U/L (ref 0–44)
AST: 23 U/L (ref 15–41)
Albumin: 4 g/dL (ref 3.5–5.0)
Alkaline Phosphatase: 89 U/L (ref 38–126)
Anion gap: 14 (ref 5–15)
BUN: 7 mg/dL (ref 6–20)
CO2: 23 mmol/L (ref 22–32)
Calcium: 9.6 mg/dL (ref 8.9–10.3)
Chloride: 100 mmol/L (ref 98–111)
Creatinine, Ser: 0.69 mg/dL (ref 0.44–1.00)
GFR, Estimated: >60 mL/min (ref >60)
Glucose, Bld: 127 mg/dL — ABNORMAL HIGH (ref 70–99)
Potassium: 3.2 mmol/L — ABNORMAL LOW (ref 3.5–5.1)
Sodium: 137 mmol/L (ref 135–145)
Total Bilirubin: 0.5 mg/dL (ref 0.0–1.2)
Total Protein: 7.8 g/dL (ref 6.5–8.1)

## 2024-05-27 LAB — URINALYSIS, ROUTINE W REFLEX MICROSCOPIC
Bilirubin Urine: NEGATIVE
Glucose, UA: NEGATIVE mg/dL
Hgb urine dipstick: NEGATIVE
Ketones, ur: NEGATIVE mg/dL
Leukocytes,Ua: NEGATIVE
Nitrite: NEGATIVE
Protein, ur: NEGATIVE mg/dL
Specific Gravity, Urine: 1.006 (ref 1.005–1.030)
pH: 6 (ref 5.0–8.0)

## 2024-05-27 LAB — CBC
HCT: 40.6 % (ref 36.0–46.0)
Hemoglobin: 12.7 g/dL (ref 12.0–15.0)
MCH: 24.7 pg — ABNORMAL LOW (ref 26.0–34.0)
MCHC: 31.3 g/dL (ref 30.0–36.0)
MCV: 78.8 fL — ABNORMAL LOW (ref 80.0–100.0)
Platelets: 311 K/uL (ref 150–400)
RBC: 5.15 MIL/uL — ABNORMAL HIGH (ref 3.87–5.11)
RDW: 17.2 % — ABNORMAL HIGH (ref 11.5–15.5)
WBC: 8.2 K/uL (ref 4.0–10.5)
nRBC: 0 % (ref 0.0–0.2)

## 2024-05-27 LAB — LIPASE, BLOOD: Lipase: 31 U/L (ref 11–51)

## 2024-05-27 MED ORDER — LACTATED RINGERS IV BOLUS
1000.0000 mL | Freq: Once | INTRAVENOUS | Status: AC
  Administered 2024-05-27: 1000 mL via INTRAVENOUS

## 2024-05-27 MED ORDER — IOHEXOL 300 MG/ML  SOLN
100.0000 mL | Freq: Once | INTRAMUSCULAR | Status: AC | PRN
  Administered 2024-05-27: 100 mL via INTRAVENOUS

## 2024-05-27 MED ORDER — DICYCLOMINE HCL 20 MG PO TABS: 20.0000 mg | ORAL_TABLET | Freq: Two times a day (BID) | ORAL | 0 refills | Status: AC

## 2024-05-27 MED ORDER — POTASSIUM CHLORIDE CRYS ER 20 MEQ PO TBCR
40.0000 meq | EXTENDED_RELEASE_TABLET | Freq: Once | ORAL | Status: AC
  Administered 2024-05-27: 40 meq via ORAL
  Filled 2024-05-27: qty 2

## 2024-05-27 NOTE — Discharge Instructions (Addendum)
 Please follow-up closely with your primary care doctor on an outpatient basis.  Return to emergency department immediately for any new or worsening symptoms.

## 2024-05-27 NOTE — ED Provider Notes (Signed)
 [place] EMERGENCY DEPARTMENT AT [facility] Provider Note   CSN: [id number] Arrival date & time: 05/27/24  1153     Patient presents with: Abdominal Pain   Ashley Rojas is a 58 y.o. female.   Patient is a 58 year old female who presents emergency department chief complaint of lower abdominal pain which has been ongoing for about the past 3 days.  The pain has been intermittent in nature and described as a cramping sensation.  She does admit to decreased appetite.  She denies any changes in urination.  She notes that she has had no constipation or diarrhea but does note that her stool was darker this morning.  There was no direct melena, hematochezia.  She has had no associated nausea, vomiting.   Abdominal Pain      Prior to Admission medications   Medication Sig Start Date End Date Taking? Authorizing Provider  albuterol  (VENTOLIN  HFA) 108 (90 Base) MCG/ACT inhaler Inhale 2 puffs into the lungs every 6 (six) hours as needed for wheezing or shortness of breath.    [provider]  albuterol  (VENTOLIN  HFA) 108 (90 Base) MCG/ACT inhaler Inhale 2 puffs into the lungs every 6 (six) hours as needed for wheezing or shortness of breath. 02/04/24   Shahmehdi, Adriana LABOR, MD  amLODipine  (NORVASC ) 10 MG tablet Take 10 mg by mouth daily. 05/29/21   [provider]  aspirin  EC 325 MG tablet Take 325 mg by mouth daily.    [provider]  atenolol  (TENORMIN ) 50 MG tablet Take 50 mg by mouth daily. 05/29/21   [provider]  baclofen  (LIORESAL ) 10 MG tablet Take 1 tablet (10 mg total) by mouth 3 (three) times daily as needed for muscle spasms. 04/18/24   Skeet Juliene SAUNDERS, DO  benzonatate  (TESSALON  PERLES) 100 MG capsule Take 1 capsule (100 mg total) by mouth 3 (three) times daily as needed for cough. Patient not taking: Reported on 04/18/2024 02/04/24 02/03/25  Shahmehdi, Seyed A, MD  buPROPion (WELLBUTRIN SR) 150 MG 12 hr tablet Take 150 mg by mouth 2 (two)  times daily. Patient not taking: Reported on 04/18/2024    [provider]  cholecalciferol (VITAMIN D3) 25 MCG (1000 UNIT) tablet Take 1,000 Units by mouth daily.    [provider]  DULoxetine  (CYMBALTA ) 30 MG capsule Take 1 capsule (30 mg total) by mouth daily. Take with 60mg  capsule. 04/18/24   Skeet Juliene SAUNDERS, DO  DULoxetine  (CYMBALTA ) 60 MG capsule Take 1 capsule (60 mg total) by mouth daily. Take with 30mg  capsule. 04/18/24   Skeet Juliene SAUNDERS, DO  gabapentin  (NEURONTIN ) 600 MG tablet Take 2 tablets (1,200 mg total) by mouth in the morning and at bedtime. 04/18/24   Skeet Juliene SAUNDERS, DO  guaiFENesin -dextromethorphan  (ROBITUSSIN DM) 100-10 MG/5ML syrup Take 5 mLs by mouth every 4 (four) hours as needed for cough. Patient not taking: Reported on 04/18/2024 02/04/24   Willette Adriana LABOR, MD  methylPREDNISolone  (MEDROL  DOSEPAK) 4 MG TBPK tablet Medrol  Dosepak take as instructed Patient not taking: Reported on 04/18/2024 02/04/24   Willette Adriana LABOR, MD  nicotine  (NICODERM CQ  - DOSED IN MG/24 HOURS) 21 mg/24hr patch Place 1 patch (21 mg total) onto the skin daily. Patient not taking: Reported on 04/18/2024 02/05/24   Willette Adriana LABOR, MD  pantoprazole  (PROTONIX ) 40 MG tablet Take 1 tablet (40 mg total) by mouth daily for 10 days. 02/05/24 02/15/24  Willette Adriana LABOR, MD    Allergies: Patient has no known allergies.  Review of Systems  Gastrointestinal:  Positive for abdominal pain.  All other systems reviewed and are negative.   Updated Vital Signs BP (!) 143/75   Pulse 74   Temp 98.7 F (37.1 C) (Oral)   Resp 18   Ht 5' 4 (1.626 m)   Wt 64.4 kg   SpO2 95%   BMI 24.37 kg/m   Physical Exam Vitals reviewed.  Constitutional:      Appearance: Normal appearance.  HENT:     Head: Normocephalic and atraumatic.     Nose: Nose normal.     Mouth/Throat:     Mouth: Mucous membranes are moist.  Eyes:     Extraocular Movements: Extraocular movements intact.      Conjunctiva/sclera: Conjunctivae normal.     Pupils: Pupils are equal, round, and reactive to light.  Cardiovascular:     Rate and Rhythm: Normal rate and regular rhythm.     Pulses: Normal pulses.     Heart sounds: Normal heart sounds. No murmur heard.    No gallop.  Pulmonary:     Effort: Pulmonary effort is normal. No respiratory distress.     Breath sounds: Normal breath sounds. No stridor. No wheezing, rhonchi or rales.  Abdominal:     General: Abdomen is flat. Bowel sounds are normal. There is no distension. There are no signs of injury.     Palpations: Abdomen is soft. There is no mass.     Tenderness: There is no abdominal tenderness.     Hernia: No hernia is present.  Musculoskeletal:        General: Normal range of motion.     Cervical back: Normal range of motion and neck supple.  Skin:    General: Skin is warm and dry.  Neurological:     General: No focal deficit present.     Mental Status: She is alert and oriented to person, place, and time. Mental status is at baseline.  Psychiatric:        Mood and Affect: Mood normal.        Behavior: Behavior normal.        Thought Content: Thought content normal.        Judgment: Judgment normal.     (all labs ordered are listed, but only abnormal results are displayed) Labs Reviewed  COMPREHENSIVE METABOLIC PANEL WITH GFR - Abnormal; Notable for the following components:      Result Value   Potassium 3.2 (*)    Glucose, Bld 127 (*)    All other components within normal limits  CBC - Abnormal; Notable for the following components:   RBC 5.15 (*)    MCV 78.8 (*)    MCH 24.7 (*)    RDW 17.2 (*)    All other components within normal limits  LIPASE, BLOOD  URINALYSIS, ROUTINE W REFLEX MICROSCOPIC    EKG: None  Radiology: CT ABDOMEN PELVIS W CONTRAST Result Date: 05/27/2024 CLINICAL DATA:  Lower abdominal pain, nausea, dark bowel movement * Tracking Code: BO * EXAM: CT ABDOMEN AND PELVIS WITH CONTRAST TECHNIQUE:  Multidetector CT imaging of the abdomen and pelvis was performed using the standard protocol following bolus administration of intravenous contrast. RADIATION DOSE REDUCTION: This exam was performed according to the departmental dose-optimization program which includes automated exposure control, adjustment of the mA and/or kV according to patient size and/or use of iterative reconstruction technique. CONTRAST:  OMNIPAQUE  IOHEXOL  300 MG/ML  SOLN COMPARISON:  07/25/2018 FINDINGS: Lower chest: No acute abnormality.  Coronary artery calcifications. Hepatobiliary: No solid liver abnormality is seen. No gallstones, gallbladder wall thickening, or biliary dilatation. Pancreas: Unremarkable. No pancreatic ductal dilatation or surrounding inflammatory changes. Spleen: Normal in size without significant abnormality. Adrenals/Urinary Tract: Heterogeneous nodule of the left adrenal gland measuring 3.1 x 2.8 cm, increased in size compared to prior examination dated 2019 at which time it measured 2.5 x 2.3 cm (series 2, image 16). Normal right adrenal. Kidneys are normal, without renal calculi, solid lesion, or hydronephrosis. Bladder is unremarkable. Stomach/Bowel: Stomach is within normal limits. Unchanged postoperative findings of partial right hemicolectomy and reanastomosis. Sizable, densely calcified fecalith in the cecal base. No evidence of bowel wall thickening, distention, or inflammatory changes. Vascular/Lymphatic: Severe aortic atherosclerosis. No enlarged abdominal or pelvic lymph nodes. Reproductive: Status post hysterectomy. Other: No abdominal wall hernia or abnormality. No ascites. Musculoskeletal: No acute or significant osseous findings. IMPRESSION: 1. No acute CT findings of the abdomen or pelvis to explain lower abdominal pain, nausea, or dark bowel movement. 2. Unchanged postoperative findings of partial right hemicolectomy and reanastomosis. 3. Heterogeneous nodule of the left adrenal gland  measuring 3.1 x 2.8 cm, slightly increased in size compared to examination dated 2019. This remains almost certainly a benign adrenal adenoma. 4. Coronary artery disease. Aortic Atherosclerosis (ICD10-I70.0). Electronically Signed   By: Marolyn JONETTA Jaksch M.D.   On: 05/27/2024 13:24     Procedures   Medications Ordered in the ED  potassium chloride  SA (KLOR-CON  M) CR tablet 40 mEq (has no administration in time range)  lactated ringers  bolus 1,000 mL (1,000 mLs Intravenous Bolus 05/27/24 1241)  iohexol  (OMNIPAQUE ) 300 MG/ML solution 100 mL (100 mLs Intravenous Contrast Given 05/27/24 1304)                                    Medical Decision Making Amount and/or Complexity of Data Reviewed Labs: ordered. Radiology: ordered.  Risk Prescription drug management.   This patient presents to the ED for concern of abdominal pain differential diagnosis includes acute appendicitis, cholecystitis, bowel obstruction, diverticulitis, ovarian torsion or cyst, PID, tumor and abscess, pyelonephritis, kidney stone, pancreatitis, mesenteric ischemia    Additional history obtained:  Additional history obtained from none External records from outside source obtained and reviewed including none   Lab Tests:  I Ordered, and personally interpreted labs.  The pertinent results include: No leukocytosis, no anemia, normal kidney function liver function, mild hypokalemia, negative lipase, unremarkable urinalysis   Imaging Studies ordered:  I ordered imaging studies including CT scan of the abdomen and pelvis I independently visualized and interpreted imaging which showed no acute surgical process I agree with the radiologist interpretation   Medicines ordered and prescription drug management:  I ordered medication including IV fluids, potassium for hypokalemia Reevaluation of the patient after these medicines showed that the patient improved I have reviewed the patients home medicines and have made  adjustments as needed   Problem List / ED Course:  Patient is doing well at this time and is stable for discharge home.  Discussed with patient all workup in the emergency department has been unremarkable.  CT scan of abdomen pelvis demonstrated no signs of acute surgical process.  She has stable vital signs with no indication for sepsis at this point.  Urinalysis demonstrated no indication for urinary tract infection.  Suspect that symptoms may be secondary to bowel spasm at this point will treat symptomatically on outpatient basis.  Close follow-up with PCP was discussed as well as strict turn precautions for any new or worsening symptoms.  Patient voiced understanding to the plan and had no additional questions.   Social Determinants of Health:  None        Final diagnoses:  None    ED Discharge Orders     None          Daralene Lonni JONETTA DEVONNA 05/27/24 1350    Suzette Pac, MD 05/28/24 [phone]

## 2024-05-27 NOTE — ED Triage Notes (Signed)
 Pt with abd pain with nausea, denies emesis or diarrhea.  Pain x 3 days.  BM this morning and dark in color per pt.

## 2024-05-30 ENCOUNTER — Emergency Department (HOSPITAL_COMMUNITY)
Admission: EM | Admit: 2024-05-30 | Discharge: 2024-05-30 | Disposition: A | Discharge status: Home / Self Care | Attending: Emergency Medicine | Admitting: Emergency Medicine

## 2024-05-30 ENCOUNTER — Other Ambulatory Visit: Payer: Self-pay

## 2024-05-30 ENCOUNTER — Emergency Department (HOSPITAL_COMMUNITY)

## 2024-05-30 DIAGNOSIS — Z7982 Long term (current) use of aspirin: Secondary | ICD-10-CM | POA: Diagnosis not present

## 2024-05-30 DIAGNOSIS — R0602 Shortness of breath: Secondary | ICD-10-CM | POA: Diagnosis present

## 2024-05-30 DIAGNOSIS — I10 Essential (primary) hypertension: Secondary | ICD-10-CM | POA: Insufficient documentation

## 2024-05-30 DIAGNOSIS — Z7952 Long term (current) use of systemic steroids: Secondary | ICD-10-CM | POA: Insufficient documentation

## 2024-05-30 DIAGNOSIS — J441 Chronic obstructive pulmonary disease with (acute) exacerbation: Secondary | ICD-10-CM | POA: Diagnosis not present

## 2024-05-30 DIAGNOSIS — Z7951 Long term (current) use of inhaled steroids: Secondary | ICD-10-CM | POA: Insufficient documentation

## 2024-05-30 LAB — COMPREHENSIVE METABOLIC PANEL WITH GFR
ALT: 14 U/L (ref 0–44)
AST: 26 U/L (ref 15–41)
Albumin: 4 g/dL (ref 3.5–5.0)
Alkaline Phosphatase: 85 U/L (ref 38–126)
Anion gap: 16 — ABNORMAL HIGH (ref 5–15)
BUN: 8 mg/dL (ref 6–20)
CO2: 22 mmol/L (ref 22–32)
Calcium: 9.4 mg/dL (ref 8.9–10.3)
Chloride: 103 mmol/L (ref 98–111)
Creatinine, Ser: 0.68 mg/dL (ref 0.44–1.00)
GFR, Estimated: >60 mL/min (ref >60)
Glucose, Bld: 101 mg/dL — ABNORMAL HIGH (ref 70–99)
Potassium: 3.8 mmol/L (ref 3.5–5.1)
Sodium: 141 mmol/L (ref 135–145)
Total Bilirubin: 0.3 mg/dL (ref 0.0–1.2)
Total Protein: 7.6 g/dL (ref 6.5–8.1)

## 2024-05-30 LAB — TROPONIN I (HIGH SENSITIVITY): Troponin I (High Sensitivity): 3 ng/L (ref <18)

## 2024-05-30 LAB — CBC WITH DIFFERENTIAL/PLATELET
Abs Immature Granulocytes: 0.05 K/uL (ref 0.00–0.07)
Basophils Absolute: 0.1 K/uL (ref 0.0–0.1)
Basophils Relative: 1 %
Eosinophils Absolute: 0.1 K/uL (ref 0.0–0.5)
Eosinophils Relative: 1 %
HCT: 38.3 % (ref 36.0–46.0)
Hemoglobin: 12.4 g/dL (ref 12.0–15.0)
Immature Granulocytes: 1 %
Lymphocytes Relative: 23 %
Lymphs Abs: 2.4 K/uL (ref 0.7–4.0)
MCH: 25.3 pg — ABNORMAL LOW (ref 26.0–34.0)
MCHC: 32.4 g/dL (ref 30.0–36.0)
MCV: 78.2 fL — ABNORMAL LOW (ref 80.0–100.0)
Monocytes Absolute: 0.8 K/uL (ref 0.1–1.0)
Monocytes Relative: 7 %
Neutro Abs: 7.3 K/uL (ref 1.7–7.7)
Neutrophils Relative %: 67 %
Platelets: 286 K/uL (ref 150–400)
RBC: 4.9 MIL/uL (ref 3.87–5.11)
RDW: 17.7 % — ABNORMAL HIGH (ref 11.5–15.5)
WBC: 10.6 K/uL — ABNORMAL HIGH (ref 4.0–10.5)
nRBC: 0 % (ref 0.0–0.2)

## 2024-05-30 LAB — BRAIN NATRIURETIC PEPTIDE: B Natriuretic Peptide: 26 pg/mL (ref 0.0–100.0)

## 2024-05-30 MED ORDER — MAGNESIUM SULFATE 2 GM/50ML IV SOLN
2.0000 g | Freq: Once | INTRAVENOUS | Status: AC
  Administered 2024-05-30: 2 g via INTRAVENOUS
  Filled 2024-05-30: qty 50

## 2024-05-30 MED ORDER — IPRATROPIUM-ALBUTEROL 0.5-2.5 (3) MG/3ML IN SOLN
3.0000 mL | Freq: Once | RESPIRATORY_TRACT | Status: AC
  Administered 2024-05-30: 3 mL via RESPIRATORY_TRACT
  Filled 2024-05-30: qty 6

## 2024-05-30 MED ORDER — IPRATROPIUM-ALBUTEROL 0.5-2.5 (3) MG/3ML IN SOLN
3.0000 mL | Freq: Once | RESPIRATORY_TRACT | Status: AC
  Administered 2024-05-30: 3 mL via RESPIRATORY_TRACT

## 2024-05-30 MED ORDER — PREDNISONE 10 MG PO TABS
20.0000 mg | ORAL_TABLET | Freq: Two times a day (BID) | ORAL | 0 refills | Status: AC
Start: 2024-05-30 — End: ?

## 2024-05-30 MED ORDER — ONDANSETRON HCL 4 MG/2ML IJ SOLN
4.0000 mg | Freq: Once | INTRAMUSCULAR | Status: AC
  Administered 2024-05-30: 4 mg via INTRAVENOUS
  Filled 2024-05-30: qty 2

## 2024-05-30 MED ORDER — METHYLPREDNISOLONE SODIUM SUCC 125 MG IJ SOLR
125.0000 mg | Freq: Once | INTRAMUSCULAR | Status: AC
  Administered 2024-05-30: 125 mg via INTRAVENOUS
  Filled 2024-05-30: qty 2

## 2024-05-30 NOTE — ED Triage Notes (Signed)
 Pt arrived via [facility] with complaints of SOB that started about an hour ago. Pt states she used her albuterol  inhaler x2 before EMS arrived. Pt arrived on 2 liters sats 100%. EMS reports temp 99.3 oral.

## 2024-05-30 NOTE — ED Provider Notes (Addendum)
 [place] EMERGENCY DEPARTMENT AT [facility] Provider Note   CSN: [id number] Arrival date & time: 05/30/24  9941     Patient presents with: Shortness of Breath   Ashley Rojas is a 58 y.o. female.   Patient is a 58 year old female brought by EMS for evaluation of shortness of breath.  She has history of COPD and hypertension.  Patient describes worsening breathing that started earlier this evening.  She denies any fevers, chills, or cough.  No chest pain or leg swelling.  She has not received any interventions by EMS, but did use her inhaler twice prior to being transported.  No alleviating factors.       Prior to Admission medications   Medication Sig Start Date End Date Taking? Authorizing Provider  albuterol  (VENTOLIN  HFA) 108 (90 Base) MCG/ACT inhaler Inhale 2 puffs into the lungs every 6 (six) hours as needed for wheezing or shortness of breath.    [provider]  albuterol  (VENTOLIN  HFA) 108 (90 Base) MCG/ACT inhaler Inhale 2 puffs into the lungs every 6 (six) hours as needed for wheezing or shortness of breath. 02/04/24   Shahmehdi, Adriana LABOR, MD  amLODipine  (NORVASC ) 10 MG tablet Take 10 mg by mouth daily. 05/29/21   [provider]  aspirin  EC 325 MG tablet Take 325 mg by mouth daily.    [provider]  atenolol  (TENORMIN ) 50 MG tablet Take 50 mg by mouth daily. 05/29/21   [provider]  baclofen  (LIORESAL ) 10 MG tablet Take 1 tablet (10 mg total) by mouth 3 (three) times daily as needed for muscle spasms. 04/18/24   Skeet Juliene SAUNDERS, DO  benzonatate  (TESSALON  PERLES) 100 MG capsule Take 1 capsule (100 mg total) by mouth 3 (three) times daily as needed for cough. Patient not taking: Reported on 04/18/2024 02/04/24 02/03/25  Shahmehdi, Seyed A, MD  buPROPion (WELLBUTRIN SR) 150 MG 12 hr tablet Take 150 mg by mouth 2 (two) times daily. Patient not taking: Reported on 04/18/2024    [provider]  cholecalciferol (VITAMIN D3) 25  MCG (1000 UNIT) tablet Take 1,000 Units by mouth daily.    [provider]  dicyclomine  (BENTYL ) 20 MG tablet Take 1 tablet (20 mg total) by mouth 2 (two) times daily. 05/27/24   Daralene Lonni BIRCH, PA-C  DULoxetine  (CYMBALTA ) 30 MG capsule Take 1 capsule (30 mg total) by mouth daily. Take with 60mg  capsule. 04/18/24   Skeet Juliene SAUNDERS, DO  DULoxetine  (CYMBALTA ) 60 MG capsule Take 1 capsule (60 mg total) by mouth daily. Take with 30mg  capsule. 04/18/24   Skeet Juliene SAUNDERS, DO  gabapentin  (NEURONTIN ) 600 MG tablet Take 2 tablets (1,200 mg total) by mouth in the morning and at bedtime. 04/18/24   Skeet Juliene SAUNDERS, DO  guaiFENesin -dextromethorphan  (ROBITUSSIN DM) 100-10 MG/5ML syrup Take 5 mLs by mouth every 4 (four) hours as needed for cough. Patient not taking: Reported on 04/18/2024 02/04/24   Willette Adriana LABOR, MD  methylPREDNISolone  (MEDROL  DOSEPAK) 4 MG TBPK tablet Medrol  Dosepak take as instructed Patient not taking: Reported on 04/18/2024 02/04/24   Willette Adriana LABOR, MD  nicotine  (NICODERM CQ  - DOSED IN MG/24 HOURS) 21 mg/24hr patch Place 1 patch (21 mg total) onto the skin daily. Patient not taking: Reported on 04/18/2024 02/05/24   Willette Adriana LABOR, MD  pantoprazole  (PROTONIX ) 40 MG tablet Take 1 tablet (40 mg total) by mouth daily for 10 days. 02/05/24 02/15/24  Willette Adriana LABOR, MD    Allergies: Patient  has no known allergies.    Review of Systems  All other systems reviewed and are negative.   Updated Vital Signs BP (!) 151/90   Pulse 88   Temp 98.6 F (37 C) (Oral)   Resp (!) 21   Ht 5' 4 (1.626 m)   Wt 64.4 kg   SpO2 96%   BMI 24.37 kg/m   Physical Exam Vitals and nursing note reviewed.  Constitutional:      General: She is not in acute distress.    Appearance: She is well-developed. She is not diaphoretic.  HENT:     Head: Normocephalic and atraumatic.  Cardiovascular:     Rate and Rhythm: Normal rate and regular rhythm.     Heart sounds: No murmur heard.    No  friction rub. No gallop.  Pulmonary:     Effort: Pulmonary effort is normal. No respiratory distress.     Breath sounds: Examination of the right-middle field reveals rhonchi. Examination of the left-middle field reveals rhonchi. Rhonchi present. No wheezing.  Abdominal:     General: Bowel sounds are normal. There is no distension.     Palpations: Abdomen is soft.     Tenderness: There is no abdominal tenderness.  Musculoskeletal:        General: Normal range of motion.     Cervical back: Normal range of motion and neck supple.     Right lower leg: No tenderness. No edema.     Left lower leg: No tenderness. No edema.  Skin:    General: Skin is warm and dry.  Neurological:     General: No focal deficit present.     Mental Status: She is alert and oriented to person, place, and time.     (all labs ordered are listed, but only abnormal results are displayed) Labs Reviewed - No data to display  EKG: EKG Interpretation Date/Time:  Tuesday May 30 2024 03:15:16 EDT Ventricular Rate:  88 PR Interval:  154 QRS Duration:  88 QT Interval:  379 QTC Calculation: 459 R Axis:   70  Text Interpretation: Sinus rhythm Normal ECG Confirmed by Geroldine Berg ([id number]) on 05/30/2024 3:19:46 AM  Radiology: No results found.   Procedures   Medications Ordered in the ED  methylPREDNISolone  sodium succinate (SOLU-MEDROL ) 125 mg/2 mL injection 125 mg (has no administration in time range)  magnesium  sulfate IVPB 2 g 50 mL (has no administration in time range)  ipratropium-albuterol  (DUONEB) 0.5-2.5 (3) MG/3ML nebulizer solution 3 mL (has no administration in time range)  ipratropium-albuterol  (DUONEB) 0.5-2.5 (3) MG/3ML nebulizer solution 3 mL (has no administration in time range)                                    Medical Decision Making Amount and/or Complexity of Data Reviewed Labs: ordered. Radiology: ordered.  Risk Prescription drug management.   Patient is a 58 year old female  with history of COPD presenting with complaints of shortness of breath and wheezing.  Patient arrives here with stable vital signs and is afebrile.  There is no hypoxia and no respiratory distress.  She does have slight expiratory wheezes bilaterally.  Laboratory studies obtained including CBC, CMP, troponin, and BNP.  All studies basically unremarkable.  Chest x-ray is clear.  Patient has received IV Solu-Medrol  along with magnesium  and DuoNeb x 2.  Upon my reassessment, she is in no respiratory distress with oxygen saturations of 98%.  Her breath sounds are now clear.  She did report some nausea and was given Zofran .  At this point, this appears to be a COPD exacerbation.  There is nothing concerning up from a cardiac standpoint and I feel as though she can safely be discharged.  I will prescribe a course of prednisone  and have her continue use of her inhaler.     Final diagnoses:  None    ED Discharge Orders     None          Geroldine Berg, MD 05/30/24 9691    Geroldine Berg, MD 05/30/24 [phone]

## 2024-05-30 NOTE — Discharge Instructions (Signed)
 Begin taking prednisone  as prescribed.  Continue use of your inhaler, 2 puffs every 4 hours as needed.  Follow-up with primary doctor if not improving in the next few days, and return to the ER if symptoms significantly worsen or change.

## 2024-05-31 ENCOUNTER — Encounter (HOSPITAL_COMMUNITY)

## 2024-06-01 ENCOUNTER — Telehealth (HOSPITAL_COMMUNITY): Payer: Self-pay

## 2024-06-01 NOTE — Telephone Encounter (Signed)
 Patient with an ED visit on 7/8 for COPD exacerbation and called and spoke to patient regarding no show appt yesterday and she states she forgot; states she plans to attend her next appointment on 7/14.   9:41 AM, 06/01/24 [place] Small Antonia Jicha MPT [place] [facility] [place] [phone] Ph:[phone]

## 2024-06-05 ENCOUNTER — Encounter (HOSPITAL_COMMUNITY): Admitting: Physical Therapy

## 2024-06-05 ENCOUNTER — Telehealth (HOSPITAL_COMMUNITY): Payer: Self-pay | Admitting: Physical Therapy

## 2024-06-05 NOTE — Telephone Encounter (Signed)
 Pt did not show for appt.  Called mobile number listed for pt and spoke to daugther.  She states she was at the hospital and could not get her mom here for appt today.  Reminded of next scheduled appt and states she will have her here.   Greig KATHEE Fuse, PTA/CLT [facility] Ph: [phone]

## 2024-06-06 ENCOUNTER — Other Ambulatory Visit: Payer: Self-pay | Admitting: Neurology

## 2024-06-07 ENCOUNTER — Telehealth (HOSPITAL_COMMUNITY): Payer: Self-pay

## 2024-06-07 ENCOUNTER — Encounter (HOSPITAL_COMMUNITY)

## 2024-06-07 NOTE — Telephone Encounter (Signed)
 No show #3; unable to leave voicemail as mailbox is full.  Will discharge due to no show policy.  Patient will need a new referral to return.    11:31 AM, 06/07/24 Ashley Rojas Ashley Rojas MPT [place] [facility] [place] [phone]

## 2024-06-12 ENCOUNTER — Encounter (HOSPITAL_COMMUNITY): Admitting: Physical Therapy

## 2024-06-15 ENCOUNTER — Encounter (HOSPITAL_COMMUNITY)

## 2024-06-19 ENCOUNTER — Encounter (HOSPITAL_COMMUNITY): Admitting: Physical Therapy

## 2024-06-21 ENCOUNTER — Encounter (HOSPITAL_COMMUNITY)

## 2024-06-27 ENCOUNTER — Encounter (HOSPITAL_COMMUNITY)

## 2024-06-29 ENCOUNTER — Encounter (HOSPITAL_COMMUNITY)

## 2024-08-10 ENCOUNTER — Telehealth: Payer: Self-pay | Admitting: Neurology

## 2024-08-10 DIAGNOSIS — M542 Cervicalgia: Secondary | ICD-10-CM

## 2024-08-10 NOTE — Telephone Encounter (Signed)
 Pt needs new pt order as she did not make NP appt and didn't resched in time

## 2024-08-10 NOTE — Telephone Encounter (Signed)
 Pt needs refill DULoxetine  (CYMBALTA ) 30 MG capsule/DULoxetine  (CYMBALTA ) 60 MG capsule/gabapentin  (NEURONTIN ) 600 MG tablet   [facility] to [facility] [address] [place]   Also change pharmacy to [facility] [address] [place]

## 2024-08-11 ENCOUNTER — Other Ambulatory Visit: Payer: Self-pay | Admitting: Neurology

## 2024-08-11 MED ORDER — DULOXETINE HCL 30 MG PO CPEP: 30.0000 mg | ORAL_CAPSULE | Freq: Every day | ORAL | 5 refills | Status: AC

## 2024-08-11 MED ORDER — DULOXETINE HCL 60 MG PO CPEP: 60.0000 mg | ORAL_CAPSULE | Freq: Every day | ORAL | 5 refills | Status: AC

## 2024-08-11 MED ORDER — GABAPENTIN 600 MG PO TABS: 1200.0000 mg | ORAL_TABLET | Freq: Two times a day (BID) | ORAL | 5 refills | Status: AC

## 2024-08-11 NOTE — Telephone Encounter (Signed)
 Per Patient daughter Carson the referral for the PT for the Neck pain.   Will add a new referral.

## 2024-08-11 NOTE — Telephone Encounter (Signed)
 Patient Daughter Carson advised per Dr.Jaffe, Refills for the gabapentin  and both duloxetine  doses [facility] [address]

## 2024-08-23 ENCOUNTER — Ambulatory Visit (HOSPITAL_COMMUNITY): Attending: Neurology

## 2024-08-23 ENCOUNTER — Other Ambulatory Visit: Payer: Self-pay

## 2024-08-23 ENCOUNTER — Encounter (HOSPITAL_COMMUNITY): Payer: Self-pay

## 2024-08-23 DIAGNOSIS — M5382 Other specified dorsopathies, cervical region: Secondary | ICD-10-CM | POA: Insufficient documentation

## 2024-08-23 DIAGNOSIS — M542 Cervicalgia: Secondary | ICD-10-CM | POA: Diagnosis not present

## 2024-08-23 DIAGNOSIS — Z7409 Other reduced mobility: Secondary | ICD-10-CM | POA: Insufficient documentation

## 2024-08-23 NOTE — Therapy (Signed)
 OUTPATIENT PHYSICAL THERAPY CERVICAL EVALUATION   Patient Name: Ashley Rojas MRN: [id number] DOB:1966/05/03, 58 y.o., female Today's Date: 08/23/2024  END OF SESSION:  PT End of Session - 08/23/24 1531     Visit Number 1    Number of Visits 8    Date for Recertification  10/18/24    Authorization Type [employer] NEXT    Authorization Time Period No auth until after visit 27 PT/OT/Speech combined    PT Start Time 1020    PT Stop Time 1100    PT Time Calculation (min) 40 min    Activity Tolerance Patient tolerated treatment well    Behavior During Therapy WFL for tasks assessed/performed         Past Medical History:  Diagnosis Date   History of ileus 2004   post-op    Hypertension    Kidney stone    stones   Past Surgical History:  Procedure Laterality Date   ABDOMINAL HYSTERECTOMY     COLON SURGERY     FRACTURE SURGERY     R femur   Patient Active Problem List   Diagnosis Date Noted   Acute respiratory failure with hypoxia (HCC) 02/03/2024   Influenza A 02/03/2024   Essential hypertension 02/03/2024   Tobacco abuse 02/03/2024    PCP: Fanta, Tesfaye Desimmie, MD  REFERRING PROVIDER: Skeet Juliene SAUNDERS, DO  REFERRING DIAG: M54.2 (ICD-10-CM) - Cervicalgia  THERAPY DIAG:  Decreased ROM of intervertebral discs of cervical spine  Impaired functional mobility and activity tolerance  Rationale for Evaluation and Treatment: Rehabilitation  ONSET DATE: 1 yr or so ago  SUBJECTIVE:                                                                                                                                                                                                         SUBJECTIVE STATEMENT: Reports when she rotates it, it will catch her so she rotates her whole body instead of rotating her neck. Patient and daughter present during session. Daughter contributes to subjective. Patient reports sharp pain when she turns her neck both ways. No inciting  incident. Reports numbness/tingling into the hands intermittently that occurred before neck pain but occurs more now. Reports mild headaches recently although they were migraines before. Has never drove.  Hand dominance: Right  PERTINENT HISTORY:  N/A  PAIN:  Are you having pain? Yes: NPRS scale: 0/10 - can get to 10/10 - average 5/10 Pain location: Neck - L side more than R side Pain description: Sharp pain with rotation to the L and  sometimes to the R Aggravating factors: Turning her head, pushing/pulling, and carrying Relieving factors: Rest, Pressure at neck  PRECAUTIONS: None  RED FLAGS: None     WEIGHT BEARING RESTRICTIONS: No  FALLS:  Has patient fallen in last 6 months? No  OCCUPATION: Caregiver, Full time - doesn't drive  PLOF: Independent  PATIENT GOALS: Manage pain in the neck   NEXT MD VISIT: A little over a year now  OBJECTIVE:  Note: Objective measures were completed at Evaluation unless otherwise noted.  DIAGNOSTIC FINDINGS:    PATIENT SURVEYS:  Neck Disability Index score: 14/50  COGNITION: Overall cognitive status: Within functional limits for tasks assessed  SENSATION: WFL  - reports intermittent   POSTURE: rounded shoulders and forward head - lateral tilt to the L side and elevated R UT/LS noted  PALPATION: L sided UT tightness/mtrp UPA L side increased pain C7-C8; relief w/   CERVICAL ROM:   Active ROM A/PROM (deg) eval  Flexion 55  Extension 61  Right lateral flexion 36  Left lateral flexion 35  Right rotation 52  Left rotation 68   (Blank rows = not tested)  *=painful  UPPER EXTREMITY ROM:  Active ROM Right eval Left eval  Shoulder flexion    Shoulder extension    Shoulder abduction    Shoulder adduction    Shoulder extension    Shoulder internal rotation    Shoulder external rotation    Elbow flexion    Elbow extension    Wrist flexion    Wrist extension    Wrist ulnar deviation    Wrist radial deviation     Wrist pronation    Wrist supination     (Blank rows = not tested)  UPPER EXTREMITY MMT:  MMT Right eval Left eval  Shoulder flexion 4 4  Shoulder extension 4 4-  Shoulder abduction 4 4  Shoulder adduction    Shoulder extension    Shoulder internal rotation    Shoulder external rotation    Middle trapezius    Lower trapezius    Elbow flexion 4+ 4  Elbow extension 4 3+  Wrist flexion 4 + 4-  Wrist extension 4 + 4-  Wrist ulnar deviation    Wrist radial deviation    Wrist pronation    Wrist supination    Grip strength     (Blank rows = not tested)  CERVICAL SPECIAL TESTS:  Spurling's test: Negative  and Distraction test: Positive  FUNCTIONAL TESTS:    TREATMENT DATE:  08/23/24:  EVAL HEP - UT, LS, SNAG, cervical rotation supine Manual PROM / STM to UT/LS and distraction                                                                                                                        PATIENT EDUCATION:  Education details: PT evaluation, objective findings, POC, Importance of HEP, Precautions, Clinic policies Person educated: Patient Education method: Explanation and Demonstration Education comprehension: verbalized understanding and returned demonstration  HOME EXERCISE PROGRAM: Access Code: [email or web address]   Exercises - Supine Cervical Rotation AROM on Pillow  - 2 x daily - 7 x weekly - 3 sets - 10 reps - 5 hold - Supine Chin Tuck  - 2 x daily - 7 x weekly - 3 sets - 10 reps - 5 hold - Seated Assisted Cervical Rotation with Towel  - 2 x daily - 7 x weekly - 3 sets - 10 reps - 5 hold - Seated Upper Trapezius Stretch  - 2 x daily - 7 x weekly - 3 sets - 10 reps - 30 hold  ASSESSMENT:  CLINICAL IMPRESSION: Patient is a 58 y.o. female who was seen today for physical therapy evaluation and treatment for M54.2 (ICD-10-CM) - Cervicalgia. Increased pain/catching with rotation ( L more than R ) and increased pain with lateral flexion bilaterally with noted decreased ROM  secondary to tightness with active movement. Passively in supine able to perform improved functional ROM post manual trp release and pin n stretch to UT/LS. Noted pt functional triceps weakness in L UE and plan to address for improved UE strength as needed for ADL performance. Would suspect facet involvement secondary to reports of pain with rotation as well as with mm C8-T1 strength deficits noted. Patient will benefit from skilled physical therapy in order to address the above in order to improve function/QOL.    OBJECTIVE IMPAIRMENTS: decreased activity tolerance, decreased ROM, increased fascial restrictions, impaired flexibility, and pain.   ACTIVITY LIMITATIONS: carrying, lifting, sleeping, and caring for others  PARTICIPATION LIMITATIONS: meal prep, cleaning, laundry, and occupation  PERSONAL FACTORS: N/A are also affecting patient's functional outcome.   REHAB POTENTIAL: Good  CLINICAL DECISION MAKING: Stable/uncomplicated  EVALUATION COMPLEXITY: Low   GOALS: Goals reviewed with patient? No  SHORT TERM GOALS: Target date: 09/11/24 Patient will be independent with performance of HEP to demonstrate adequate self management of symptoms.  Baseline:  Goal status: INITIAL  2.   Patient will report at least a 25% improvement with function or pain overall since beginning PT. Baseline:  Goal status: INITIAL   LONG TERM GOALS: Target date: 10/12/24  Patient will improve cervical rotation to at least 70 degrees bilaterally in order to be safe performing functional tasks.  Baseline:  Goal status: INITIAL  2.  Patient will improve tricep strength to at least 4/5 in order to indicate improved neural mobility and noted strength related to functional ADL performance.  Baseline:  Goal status: INITIAL  3.  Patient will report daily pain rating of 4/10 or less during ADLs to demonstrate improved activity tolerance.  Baseline:  Goal status: INITIAL  4. Patient will report at least a  50% improvement with function or pain overall since beginning PT. Baseline:  Goal status: INITIAL   PLAN:  PT FREQUENCY: 1-2x/week  PT DURATION: 8 weeks  PLANNED INTERVENTIONS: 97164- PT Re-evaluation, 97110-Therapeutic exercises, 97530- Therapeutic activity, 97112- Neuromuscular re-education, 97535- Self Care, 02859- Manual therapy, [id number]- Electrical stimulation (manual), [id number]- Traction (mechanical), 20560 (1-2 muscles), 20561 (3+ muscles)- Dry Needling, Patient/Family education, Taping, Joint mobilization, Spinal mobilization, Cryotherapy, and Moist heat  PLAN FOR NEXT SESSION: Review HEP and goals, MHP to cervical musculature, UE ROM and MMT testing, Progress cervical mobility activities/stretching  Lamarr LITTIE Citrin PT, DPT [facility] [place] [phone] office  3:59 PM, 08/23/24

## 2024-08-30 ENCOUNTER — Encounter (HOSPITAL_COMMUNITY): Payer: Self-pay

## 2024-08-30 ENCOUNTER — Ambulatory Visit (HOSPITAL_COMMUNITY)

## 2024-08-30 DIAGNOSIS — Z7409 Other reduced mobility: Secondary | ICD-10-CM

## 2024-08-30 DIAGNOSIS — M5382 Other specified dorsopathies, cervical region: Secondary | ICD-10-CM | POA: Diagnosis not present

## 2024-08-30 NOTE — Therapy (Signed)
 OUTPATIENT PHYSICAL THERAPY CERVICAL TREATMENT   Patient Name: Ashley Rojas MRN: [id number] DOB:Jan 16, 1966, 58 y.o., female Today's Date: 08/30/2024  END OF SESSION:  PT End of Session - 08/30/24 1320     Visit Number 2    Number of Visits 8    Date for Recertification  10/18/24    Authorization Type [employer] NEXT    Authorization Time Period No auth until after visit 27 PT/OT/Speech combined    Progress Note Due on Visit 10    PT Start Time 1328    PT Stop Time 1410    PT Time Calculation (min) 42 min    Activity Tolerance Patient tolerated treatment well    Behavior During Therapy WFL for tasks assessed/performed         Past Medical History:  Diagnosis Date   History of ileus 2004   post-op    Hypertension    Kidney stone    stones   Past Surgical History:  Procedure Laterality Date   ABDOMINAL HYSTERECTOMY     COLON SURGERY     FRACTURE SURGERY     R femur   Patient Active Problem List   Diagnosis Date Noted   Acute respiratory failure with hypoxia (HCC) 02/03/2024   Influenza A 02/03/2024   Essential hypertension 02/03/2024   Tobacco abuse 02/03/2024    PCP: Fanta, Tesfaye Desimmie, MD  REFERRING PROVIDER: Skeet Juliene SAUNDERS, DO  REFERRING DIAG: M54.2 (ICD-10-CM) - Cervicalgia  THERAPY DIAG:  Decreased ROM of intervertebral discs of cervical spine  Impaired functional mobility and activity tolerance  Rationale for Evaluation and Treatment: Rehabilitation  ONSET DATE: 1 yr or so ago  SUBJECTIVE:                                                                                                                                                                                                         SUBJECTIVE STATEMENT: 08/30/24:  Pt arrived with daughter. Reports of intermittent sharp pain with head movements more Lt >Rt.  Pain scale 7/10 neck pain.  Reports compliance with HEP daily without questions.    Eval:  Reports when she rotates it, it  will catch her so she rotates her whole body instead of rotating her neck. Patient and daughter present during session. Daughter contributes to subjective. Patient reports sharp pain when she turns her neck both ways. No inciting incident. Reports numbness/tingling into the hands intermittently that occurred before neck pain but occurs more now. Reports mild headaches recently although they were migraines before. Has never drove.  Hand dominance:  Right  PERTINENT HISTORY:  N/A  PAIN:  Are you having pain? Yes: NPRS scale: 0/10 - can get to 10/10 - average 5/10 Pain location: Neck - L side more than R side Pain description: Sharp pain with rotation to the L and sometimes to the R Aggravating factors: Turning her head, pushing/pulling, and carrying Relieving factors: Rest, Pressure at neck  PRECAUTIONS: None  RED FLAGS: None     WEIGHT BEARING RESTRICTIONS: No  FALLS:  Has patient fallen in last 6 months? No  OCCUPATION: Caregiver, Full time - doesn't drive  PLOF: Independent  PATIENT GOALS: Manage pain in the neck   NEXT MD VISIT: A little over a year now  OBJECTIVE:  Note: Objective measures were completed at Evaluation unless otherwise noted.  DIAGNOSTIC FINDINGS:    PATIENT SURVEYS:  Neck Disability Index score: 14/50  COGNITION: Overall cognitive status: Within functional limits for tasks assessed  SENSATION: WFL  - reports intermittent   POSTURE: rounded shoulders and forward head - lateral tilt to the L side and elevated R UT/LS noted  PALPATION: L sided UT tightness/mtrp UPA L side increased pain C7-C8; relief w/   CERVICAL ROM:   Active ROM A/PROM (deg) eval  Flexion 55  Extension 61  Right lateral flexion 36  Left lateral flexion 35  Right rotation 52  Left rotation 68   (Blank rows = not tested)  *=painful  UPPER EXTREMITY ROM:  Active ROM Right 08/30/24: Left 08/30/24:  Shoulder flexion 154 153  Shoulder extension    Shoulder abduction  165 178  Shoulder adduction    Shoulder extension    Shoulder internal rotation 60 60  Shoulder external rotation 88 90  Elbow flexion    Elbow extension    Wrist flexion    Wrist extension    Wrist ulnar deviation    Wrist radial deviation    Wrist pronation    Wrist supination     (Blank rows = not tested)  UPPER EXTREMITY MMT:  MMT Right eval Left eval  Shoulder flexion 4 4  Shoulder extension 4 4-  Shoulder abduction 4 4  Shoulder adduction    Shoulder extension    Shoulder internal rotation    Shoulder external rotation    Middle trapezius    Lower trapezius    Elbow flexion 4+ 4  Elbow extension 4 3+  Wrist flexion 4 + 4-  Wrist extension 4 + 4-  Wrist ulnar deviation    Wrist radial deviation    Wrist pronation    Wrist supination    Grip strength     (Blank rows = not tested)  CERVICAL SPECIAL TESTS:  Spurling's test: Negative  and Distraction test: Positive  FUNCTIONAL TESTS:    TREATMENT DATE:  08/30/24:   Reviewed goals Educated importance of HEP compliance for maximal benefits MHP x 5 min with slow cervical mobility in pain free range. UE ROM Measurements  Seated: Cervical retraction 10x UT stretch 2x 30  Supine:  Cervical rotation 5x Chin tuck 10x5  Manual: STM to cervical mm, PROM, trigger point release on UT and supraspinatus, manual traction, suboccipital release in supine position with LE elevated    08/23/24:  EVAL HEP - UT, LS, SNAG, cervical rotation supine Manual PROM / STM to UT/LS and distraction  PATIENT EDUCATION:  Education details: PT evaluation, objective findings, POC, Importance of HEP, Precautions, Clinic policies Person educated: Patient Education method: Explanation and Demonstration Education comprehension: verbalized understanding and returned demonstration  HOME EXERCISE PROGRAM: Access  Code: [email or web address]   Exercises - Supine Cervical Rotation AROM on Pillow  - 2 x daily - 7 x weekly - 3 sets - 10 reps - 5 hold - Supine Chin Tuck  - 2 x daily - 7 x weekly - 3 sets - 10 reps - 5 hold - Seated Assisted Cervical Rotation with Towel  - 2 x daily - 7 x weekly - 3 sets - 10 reps - 5 hold - Seated Upper Trapezius Stretch  - 2 x daily - 7 x weekly - 3 sets - 10 reps - 30 hold  ASSESSMENT:  CLINICAL IMPRESSION: 08/30/24: Reviewed goals and educated importance of HEP compliance for maximal benefits.  Pt able to recall and reports relief with pain following the exercise.  Did require some cueing to improve mechanics with cervical retraction.  ROM taken for Bil UE WFL pain free range.  Manual techniques complete to address cervical mobility and STM to address musculature restrictions with reports of pain resolved and improved mobility following.  Encouraged hydration following manual to reduce risk of headache following session.  Eval:  Patient is a 58 y.o. female who was seen today for physical therapy evaluation and treatment for M54.2 (ICD-10-CM) - Cervicalgia. Increased pain/catching with rotation ( L more than R ) and increased pain with lateral flexion bilaterally with noted decreased ROM secondary to tightness with active movement. Passively in supine able to perform improved functional ROM post manual trp release and pin n stretch to UT/LS. Noted pt functional triceps weakness in L UE and plan to address for improved UE strength as needed for ADL performance. Would suspect facet involvement secondary to reports of pain with rotation as well as with mm C8-T1 strength deficits noted. Patient will benefit from skilled physical therapy in order to address the above in order to improve function/QOL.    OBJECTIVE IMPAIRMENTS: decreased activity tolerance, decreased ROM, increased fascial restrictions, impaired flexibility, and pain.   ACTIVITY LIMITATIONS: carrying, lifting, sleeping, and  caring for others  PARTICIPATION LIMITATIONS: meal prep, cleaning, laundry, and occupation  PERSONAL FACTORS: N/A are also affecting patient's functional outcome.   REHAB POTENTIAL: Good  CLINICAL DECISION MAKING: Stable/uncomplicated  EVALUATION COMPLEXITY: Low   GOALS: Goals reviewed with patient? No  SHORT TERM GOALS: Target date: 09/11/24 Patient will be independent with performance of HEP to demonstrate adequate self management of symptoms.  Baseline:  Goal status: INITIAL  2.   Patient will report at least a 25% improvement with function or pain overall since beginning PT. Baseline:  Goal status: INITIAL   LONG TERM GOALS: Target date: 10/12/24  Patient will improve cervical rotation to at least 70 degrees bilaterally in order to be safe performing functional tasks.  Baseline:  Goal status: INITIAL  2.  Patient will improve tricep strength to at least 4/5 in order to indicate improved neural mobility and noted strength related to functional ADL performance.  Baseline:  Goal status: INITIAL  3.  Patient will report daily pain rating of 4/10 or less during ADLs to demonstrate improved activity tolerance.  Baseline:  Goal status: INITIAL  4. Patient will report at least a 50% improvement with function or pain overall since beginning PT. Baseline:  Goal status: INITIAL   PLAN:  PT FREQUENCY: 1-2x/week  PT  DURATION: 8 weeks  PLANNED INTERVENTIONS: 97164- PT Re-evaluation, 97110-Therapeutic exercises, 97530- Therapeutic activity, [id number]- Neuromuscular re-education, 97535- Self Care, 02859- Manual therapy, [id number]- Electrical stimulation (manual), [id number]- Traction (mechanical), 20560 (1-2 muscles), 20561 (3+ muscles)- Dry Needling, Patient/Family education, Taping, Joint mobilization, Spinal mobilization, Cryotherapy, and Moist heat  PLAN FOR NEXT SESSION:  MHP to cervical musculature, Progress cervical mobility activities/stretching.  Add scapular retraction, postural  stretches and stretches.  Augustin Mclean, LPTA/CLT; CBIS [phone]   5:06 PM, 08/30/24

## 2024-09-04 ENCOUNTER — Ambulatory Visit (HOSPITAL_COMMUNITY): Admitting: Physical Therapy

## 2024-09-04 DIAGNOSIS — M5382 Other specified dorsopathies, cervical region: Secondary | ICD-10-CM | POA: Diagnosis not present

## 2024-09-04 DIAGNOSIS — Z7409 Other reduced mobility: Secondary | ICD-10-CM

## 2024-09-04 NOTE — Therapy (Signed)
 OUTPATIENT PHYSICAL THERAPY CERVICAL TREATMENT   Patient Name: Ashley Rojas MRN: [id number] DOB:28-Mar-1966, 58 y.o., female Today's Date: 09/04/2024  END OF SESSION:  PT End of Session - 09/04/24 0948     Visit Number 3    Number of Visits 8    Date for Recertification  10/18/24    Authorization Type [employer] NEXT    Authorization Time Period No auth until after visit 27 PT/OT/Speech combined    Progress Note Due on Visit 10    PT Start Time 0918    PT Stop Time 0945    PT Time Calculation (min) 27 min    Activity Tolerance Patient tolerated treatment well    Behavior During Therapy [facility] for tasks assessed/performed          Past Medical History:  Diagnosis Date   History of ileus 2004   post-op    Hypertension    Kidney stone    stones   Past Surgical History:  Procedure Laterality Date   ABDOMINAL HYSTERECTOMY     COLON SURGERY     FRACTURE SURGERY     R femur   Patient Active Problem List   Diagnosis Date Noted   Acute respiratory failure with hypoxia (HCC) 02/03/2024   Influenza A 02/03/2024   Essential hypertension 02/03/2024   Tobacco abuse 02/03/2024    PCP: Fanta, Tesfaye Desimmie, MD  REFERRING PROVIDER: Skeet Juliene SAUNDERS, DO  REFERRING DIAG: M54.2 (ICD-10-CM) - Cervicalgia  THERAPY DIAG:  Decreased ROM of intervertebral discs of cervical spine  Impaired functional mobility and activity tolerance  Rationale for Evaluation and Treatment: Rehabilitation  ONSET DATE: 1 yr or so ago  SUBJECTIVE:                                                                                                                                                                                                         SUBJECTIVE STATEMENT: 09/04/24:  Pt arrived late for session. States she had some pain this morning but now she does not.  Reports compliance with HEP daily without questions.    Eval:  Reports when she rotates it, it will catch her so she  rotates her whole body instead of rotating her neck. Patient and daughter present during session. Daughter contributes to subjective. Patient reports sharp pain when she turns her neck both ways. No inciting incident. Reports numbness/tingling into the hands intermittently that occurred before neck pain but occurs more now. Reports mild headaches recently although they were migraines before. Has never drove.  Hand dominance: Right  PERTINENT  HISTORY:  N/A  PAIN:  Are you having pain? Yes: NPRS scale: 0/10  Pain location: Neck - L side more than R side Pain description: Sharp pain with rotation to the L and sometimes to the R Aggravating factors: Turning her head, pushing/pulling, and carrying Relieving factors: Rest, Pressure at neck  PRECAUTIONS: None  RED FLAGS: None     WEIGHT BEARING RESTRICTIONS: No  FALLS:  Has patient fallen in last 6 months? No  OCCUPATION: Caregiver, Full time - doesn't drive  PLOF: Independent  PATIENT GOALS: Manage pain in the neck   NEXT MD VISIT: A little over a year now  OBJECTIVE:  Note: Objective measures were completed at Evaluation unless otherwise noted.  DIAGNOSTIC FINDINGS:    PATIENT SURVEYS:  Neck Disability Index score: 14/50  COGNITION: Overall cognitive status: Within functional limits for tasks assessed  SENSATION: WFL  - reports intermittent   POSTURE: rounded shoulders and forward head - lateral tilt to the L side and elevated R UT/LS noted  PALPATION: L sided UT tightness/mtrp UPA L side increased pain C7-C8; relief w/   CERVICAL ROM:   Active ROM A/PROM (deg) eval  Flexion 55  Extension 61  Right lateral flexion 36  Left lateral flexion 35  Right rotation 52  Left rotation 68   (Blank rows = not tested)  *=painful  UPPER EXTREMITY ROM:  Active ROM Right 08/30/24: Left 08/30/24:  Shoulder flexion 154 153  Shoulder extension    Shoulder abduction 165 178  Shoulder adduction    Shoulder extension     Shoulder internal rotation 60 60  Shoulder external rotation 88 90  Elbow flexion    Elbow extension    Wrist flexion    Wrist extension    Wrist ulnar deviation    Wrist radial deviation    Wrist pronation    Wrist supination     (Blank rows = not tested)  UPPER EXTREMITY MMT:  MMT Right eval Left eval  Shoulder flexion 4 4  Shoulder extension 4 4-  Shoulder abduction 4 4  Shoulder adduction    Shoulder extension    Shoulder internal rotation    Shoulder external rotation    Middle trapezius    Lower trapezius    Elbow flexion 4+ 4  Elbow extension 4 3+  Wrist flexion 4 + 4-  Wrist extension 4 + 4-  Wrist ulnar deviation    Wrist radial deviation    Wrist pronation    Wrist supination    Grip strength     (Blank rows = not tested)  CERVICAL SPECIAL TESTS:  Spurling's test: Negative  and Distraction test: Positive  FUNCTIONAL TESTS:    TREATMENT DATE:  09/04/24 UBE 1.5' fwd/1.5'bkwd Standing:  RTB retractions 10X  RTB Rows 10X   RTB shoulder extensions 10X  Facing wall UE flexion with lift offs 10X  Back against wall UE flexion to wall 10X   Doorway stretch for chest   Seated cervical retraction 10X5 UT stretch 3X30 each side Thoracic excursions with UE movements 5X each side     08/30/24:   Reviewed goals Educated importance of HEP compliance for maximal benefits MHP x 5 min with slow cervical mobility in pain free range. UE ROM Measurements  Seated: Cervical retraction 10x UT stretch 2x 30  Supine:  Cervical rotation 5x Chin tuck 10x5  Manual: STM to cervical mm, PROM, trigger point release on UT and supraspinatus, manual traction, suboccipital release in supine position with LE  elevated    08/23/24:  EVAL HEP - UT, LS, SNAG, cervical rotation supine Manual PROM / STM to UT/LS and distraction                                                                                                                        PATIENT EDUCATION:   Education details: PT evaluation, objective findings, POC, Importance of HEP, Precautions, Clinic policies Person educated: Patient Education method: Medical illustrator Education comprehension: verbalized understanding and returned demonstration  HOME EXERCISE PROGRAM: Access Code: [email or web address]   Exercises - Supine Cervical Rotation AROM on Pillow  - 2 x daily - 7 x weekly - 3 sets - 10 reps - 5 hold - Supine Chin Tuck  - 2 x daily - 7 x weekly - 3 sets - 10 reps - 5 hold - Seated Assisted Cervical Rotation with Towel  - 2 x daily - 7 x weekly - 3 sets - 10 reps - 5 hold - Seated Upper Trapezius Stretch  - 2 x daily - 7 x weekly - 3 sets - 10 reps - 30 hold  ASSESSMENT:  CLINICAL IMPRESSION: 09/04/24: Began session with with warm up on UBE and followed with progression of postural strengthening using therabands.  Pt able to complete these with general cues for form and hold time.  Added doorway stretch for chest mm and thoracic excursions to help improve mobility.  Did not have time for Manual techniques due to late arrival, however did not have any pain so worked on strengthening progression instead.  Pt did not c/o of any pain during session or possess pain behaviors.    Eval:  Patient is a 58 y.o. female who was seen today for physical therapy evaluation and treatment for M54.2 (ICD-10-CM) - Cervicalgia. Increased pain/catching with rotation ( L more than R ) and increased pain with lateral flexion bilaterally with noted decreased ROM secondary to tightness with active movement. Passively in supine able to perform improved functional ROM post manual trp release and pin n stretch to UT/LS. Noted pt functional triceps weakness in L UE and plan to address for improved UE strength as needed for ADL performance. Would suspect facet involvement secondary to reports of pain with rotation as well as with mm C8-T1 strength deficits noted. Patient will benefit from skilled physical therapy in  order to address the above in order to improve function/QOL.    OBJECTIVE IMPAIRMENTS: decreased activity tolerance, decreased ROM, increased fascial restrictions, impaired flexibility, and pain.   ACTIVITY LIMITATIONS: carrying, lifting, sleeping, and caring for others  PARTICIPATION LIMITATIONS: meal prep, cleaning, laundry, and occupation  PERSONAL FACTORS: N/A are also affecting patient's functional outcome.   REHAB POTENTIAL: Good  CLINICAL DECISION MAKING: Stable/uncomplicated  EVALUATION COMPLEXITY: Low   GOALS: Goals reviewed with patient? No  SHORT TERM GOALS: Target date: 09/11/24 Patient will be independent with performance of HEP to demonstrate adequate self management of symptoms.  Baseline:  Goal status: INITIAL  2.  Patient will report at least a 25% improvement with function or pain overall since beginning PT. Baseline:  Goal status: INITIAL   LONG TERM GOALS: Target date: 10/12/24  Patient will improve cervical rotation to at least 70 degrees bilaterally in order to be safe performing functional tasks.  Baseline:  Goal status: INITIAL  2.  Patient will improve tricep strength to at least 4/5 in order to indicate improved neural mobility and noted strength related to functional ADL performance.  Baseline:  Goal status: INITIAL  3.  Patient will report daily pain rating of 4/10 or less during ADLs to demonstrate improved activity tolerance.  Baseline:  Goal status: INITIAL  4. Patient will report at least a 50% improvement with function or pain overall since beginning PT. Baseline:  Goal status: INITIAL   PLAN:  PT FREQUENCY: 1-2x/week  PT DURATION: 8 weeks  PLANNED INTERVENTIONS: 97164- PT Re-evaluation, 97110-Therapeutic exercises, 97530- Therapeutic activity, [id number]- Neuromuscular re-education, 97535- Self Care, 02859- Manual therapy, [id number]- Electrical stimulation (manual), [id number]- Traction (mechanical), 20560 (1-2 muscles), 20561 (3+ muscles)-  Dry Needling, Patient/Family education, Taping, Joint mobilization, Spinal mobilization, Cryotherapy, and Moist heat  PLAN FOR NEXT SESSION:  Progress cervical mobility activities/stretching. Manual as needed.  Greig KATHEE Fuse, PTA/CLT [facility] Ph: [phone]  9:49 AM, 09/04/24

## 2024-09-07 ENCOUNTER — Encounter (HOSPITAL_COMMUNITY)

## 2024-09-13 ENCOUNTER — Ambulatory Visit (HOSPITAL_COMMUNITY): Admitting: Physical Therapy

## 2024-09-13 DIAGNOSIS — Z7409 Other reduced mobility: Secondary | ICD-10-CM

## 2024-09-13 DIAGNOSIS — M5382 Other specified dorsopathies, cervical region: Secondary | ICD-10-CM | POA: Diagnosis not present

## 2024-09-13 NOTE — Therapy (Signed)
 OUTPATIENT PHYSICAL THERAPY CERVICAL TREATMENT   Patient Name: Ashley Rojas MRN: [id number] DOB:06/20/1966, 58 y.o., female Today's Date: 09/13/2024  END OF SESSION:  PT End of Session - 09/13/24 0959     Visit Number 4    Number of Visits 8    Date for Recertification  10/18/24    Authorization Type [employer] NEXT    Authorization Time Period No auth until after visit 27 PT/OT/Speech combined    Progress Note Due on Visit 10    PT Start Time 0918    PT Stop Time 0957    PT Time Calculation (min) 39 min    Activity Tolerance Patient tolerated treatment well    Behavior During Therapy [facility] for tasks assessed/performed           Past Medical History:  Diagnosis Date   History of ileus 2004   post-op    Hypertension    Kidney stone    stones   Past Surgical History:  Procedure Laterality Date   ABDOMINAL HYSTERECTOMY     COLON SURGERY     FRACTURE SURGERY     R femur   Patient Active Problem List   Diagnosis Date Noted   Acute respiratory failure with hypoxia (HCC) 02/03/2024   Influenza A 02/03/2024   Essential hypertension 02/03/2024   Tobacco abuse 02/03/2024    PCP: Fanta, Tesfaye Desimmie, MD  REFERRING PROVIDER: Skeet Juliene SAUNDERS, DO  REFERRING DIAG: M54.2 (ICD-10-CM) - Cervicalgia  THERAPY DIAG:  Decreased ROM of intervertebral discs of cervical spine  Impaired functional mobility and activity tolerance  Rationale for Evaluation and Treatment: Rehabilitation  ONSET DATE: 1 yr or so ago  SUBJECTIVE:                                                                                                                                                                                                         SUBJECTIVE STATEMENT: PT states that her neck is now hurting on the left side.   She is completing her exercises at home and has not questions on them.   PERTINENT HISTORY:  N/A  PAIN:  Are you having pain? Yes: NPRS scale: 5/10  Pain  location: Neck - L side more than R side Pain description: Sharp pain with rotation to the L and sometimes to the R Aggravating factors: Turning her head, pushing/pulling, and carrying Relieving factors: Rest, Pressure at neck  PRECAUTIONS: None  RED FLAGS: None     WEIGHT BEARING RESTRICTIONS: No  FALLS:  Has patient fallen in last 6 months?  No  OCCUPATION: Caregiver, Full time - doesn't drive  PLOF: Independent  PATIENT GOALS: Manage pain in the neck   NEXT MD VISIT: A little over a year now  OBJECTIVE:  Note: Objective measures were completed at Evaluation unless otherwise noted.  PATIENT SURVEYS:  Neck Disability Index score: 14/50  COGNITION: Overall cognitive status: Within functional limits for tasks assessed  SENSATION: WFL  - reports intermittent   POSTURE: rounded shoulders and forward head - lateral tilt to the L side and elevated R UT/LS noted  PALPATION: L sided UT tightness/mtrp UPA L side increased pain C7-C8; relief w/   CERVICAL ROM:   Active ROM A/PROM (deg) eval 09/13/24  Flexion 55 55-normal  Extension 61 63-normal  Right lateral flexion 36 32  Left lateral flexion 35 42  Right rotation 52 65  Left rotation 68 65   (Blank rows = not tested)  *=painful  UPPER EXTREMITY ROM:  Active ROM Right 08/30/24: 09/13/24 Left 08/30/24: 09/13/24  Shoulder flexion 154 175 153 175  Shoulder extension      Shoulder abduction 165 180 178 178  Shoulder adduction      Shoulder extension      Shoulder internal rotation 60 70 60 60  Shoulder external rotation 88 90 90 60  Elbow flexion      Elbow extension      Wrist flexion      Wrist extension      Wrist ulnar deviation      Wrist radial deviation      Wrist pronation      Wrist supination       (Blank rows = not tested)  UPPER EXTREMITY MMT:  MMT Right eval 09/13/24 Left eval 10/22  Shoulder flexion 4 4+ 4 4+  Shoulder extension 4 4+ 4- 4+  Shoulder abduction 4 4+ 4 4+  Shoulder  adduction      Shoulder extension      Shoulder internal rotation      Shoulder external rotation  4  4  Middle trapezius      Lower trapezius      Elbow flexion 4+  4   Elbow extension 4  3+   Wrist flexion 4 +  4-   Wrist extension 4 +  4-   Wrist ulnar deviation      Wrist radial deviation      Wrist pronation      Wrist supination      Grip strength       (Blank rows = not tested)  CERVICAL SPECIAL TESTS:  Spurling's test: Negative  and Distraction test: Positive  FUNCTIONAL TESTS:    TREATMENT DATE:  09/13/24:  reassessed see above Sitting : Cervical and thoracic excursions x 5 each Shoulder circles x 10 W back with 2 # x 10  X to V x 10  Cervical retraction x 10  Theraband: red Scapular retraction x10 Rows x 10 Shoulder extension x 10  Manual to cervical and upper trap area to decrease pain.   PATIENT EDUCATION:  Education details: PT evaluation, objective findings, POC, Importance of HEP, Precautions, Clinic policies Person educated: Patient Education method: Explanation and Demonstration Education comprehension: verbalized understanding and returned demonstration  HOME EXERCISE PROGRAM: Access Code: [email or web address]   Exercises - Supine Cervical Rotation AROM on Pillow  - 2 x daily - 7 x weekly - 3 sets - 10 reps - 5 hold - Supine Chin Tuck  - 2 x daily - 7 x weekly -  3 sets - 10 reps - 5 hold - Seated Assisted Cervical Rotation with Towel  - 2 x daily - 7 x weekly - 3 sets - 10 reps - 5 hold - Seated Upper Trapezius Stretch  - 2 x daily - 7 x weekly - 3 sets - 10 reps - 30 hold  ASSESSMENT:  CLINICAL IMPRESSION:  PT  reassessed with noted improvement in cervical ROM and UE strength.  PT needs verbal and tactile cuing for proper execution of exercises.  Added w back and x to V to improve cervical stability.  PT will continue to benefit from skilled therapy to improve her strength and decrease her pain.   OBJECTIVE IMPAIRMENTS: decreased activity tolerance,  decreased ROM, increased fascial restrictions, impaired flexibility, and pain.   ACTIVITY LIMITATIONS: carrying, lifting, sleeping, and caring for others  PARTICIPATION LIMITATIONS: meal prep, cleaning, laundry, and occupation  PERSONAL FACTORS: N/A are also affecting patient's functional outcome.   REHAB POTENTIAL: Good  CLINICAL DECISION MAKING: Stable/uncomplicated  EVALUATION COMPLEXITY: Low   GOALS: Goals reviewed with patient? No  SHORT TERM GOALS: Target date: 09/11/24 Patient will be independent with performance of HEP to demonstrate adequate self management of symptoms.  Baseline:  Goal status: on-going   2.   Patient will report at least a 25% improvement with function or pain overall since beginning PT. Baseline:  Goal status: on-going     LONG TERM GOALS: Target date: 10/12/24  Patient will improve cervical rotation to at least 70 degrees bilaterally in order to be safe performing functional tasks.  Baseline:  Goal status: on-going    2.  Patient will improve tricep strength to at least 4/5 in order to indicate improved neural mobility and noted strength related to functional ADL performance.  Baseline:  Goal status:on-going    3.  Patient will report daily pain rating of 4/10 or less during ADLs to demonstrate improved activity tolerance.  Baseline:  Goal status: on-going    4. Patient will report at least a 50% improvement with function or pain overall since beginning PT. Baseline:  Goal status: on-going     PLAN:  PT FREQUENCY: 1-2x/week  PT DURATION: 8 weeks  PLANNED INTERVENTIONS: 97164- PT Re-evaluation, 97110-Therapeutic exercises, 97530- Therapeutic activity, [id number]- Neuromuscular re-education, 97535- Self Care, 02859- Manual therapy, [id number]- Electrical stimulation (manual), [id number]- Traction (mechanical), 20560 (1-2 muscles), 20561 (3+ muscles)- Dry Needling, Patient/Family education, Taping, Joint mobilization, Spinal mobilization,  Cryotherapy, and Moist heat  PLAN FOR NEXT SESSION: Add cervical isometrics and wall push up.   Give pt theraband for home use.  Progress cervical mobility activities/stretching. Manual as needed. Montie Metro, PT CLT [phone]  [facility] Ph: [phone]  9:59 AM, 09/13/24

## 2024-09-20 ENCOUNTER — Encounter (HOSPITAL_COMMUNITY)

## 2024-09-27 ENCOUNTER — Encounter (HOSPITAL_COMMUNITY): Payer: Self-pay

## 2024-09-27 ENCOUNTER — Ambulatory Visit (HOSPITAL_COMMUNITY): Attending: Neurology

## 2024-09-27 DIAGNOSIS — M5382 Other specified dorsopathies, cervical region: Secondary | ICD-10-CM | POA: Insufficient documentation

## 2024-09-27 DIAGNOSIS — Z7409 Other reduced mobility: Secondary | ICD-10-CM | POA: Insufficient documentation

## 2024-09-27 NOTE — Therapy (Signed)
 OUTPATIENT PHYSICAL THERAPY CERVICAL TREATMENT   Patient Name: Ashley Rojas MRN: [id number] DOB:[birth year], 58 y.o., female Today's Date: 09/27/2024  END OF SESSION:  PT End of Session - 09/27/24 0913     Visit Number 5    Number of Visits 8    Date for Recertification  10/18/24    Authorization Type [employer] NEXT    Authorization Time Period No auth until after visit 27 PT/OT/Speech combined    Progress Note Due on Visit 10    PT Start Time 0913   late arrival   PT Stop Time 0945    PT Time Calculation (min) 32 min    Activity Tolerance Patient tolerated treatment well    Behavior During Therapy [facility] for tasks assessed/performed            Past Medical History:  Diagnosis Date   History of ileus 2004   post-op    Hypertension    Kidney stone    stones   Past Surgical History:  Procedure Laterality Date   ABDOMINAL HYSTERECTOMY     COLON SURGERY     FRACTURE SURGERY     R femur   Patient Active Problem List   Diagnosis Date Noted   Acute respiratory failure with hypoxia (HCC) 02/03/2024   Influenza A 02/03/2024   Essential hypertension 02/03/2024   Tobacco abuse 02/03/2024    PCP: Fanta, Tesfaye Desimmie, MD  REFERRING PROVIDER: Skeet Juliene SAUNDERS, DO  REFERRING DIAG: M54.2 (ICD-10-CM) - Cervicalgia  THERAPY DIAG:  Decreased ROM of intervertebral discs of cervical spine  Impaired functional mobility and activity tolerance  Rationale for Evaluation and Treatment: Rehabilitation  ONSET DATE: 1 yr or so ago  SUBJECTIVE:                                                                                                                                                                                                         SUBJECTIVE STATEMENT: Pt states neck is still bothering her turning, has not been driving. Pt states she is in 0/10 pain upon presentation, only when she turns she gets sharp pain that shoots up to her head.    PERTINENT HISTORY:   N/A  PAIN:  Are you having pain? Yes: NPRS scale: 5/10  Pain location: Neck - L side more than R side Pain description: Sharp pain with rotation to the L and sometimes to the R Aggravating factors: Turning her head, pushing/pulling, and carrying Relieving factors: Rest, Pressure at neck  PRECAUTIONS: None  RED FLAGS: None     WEIGHT  BEARING RESTRICTIONS: No  FALLS:  Has patient fallen in last 6 months? No  OCCUPATION: Caregiver, Full time - doesn't drive  PLOF: Independent  PATIENT GOALS: Manage pain in the neck   NEXT MD VISIT: A little over a year now  OBJECTIVE:  Note: Objective measures were completed at Evaluation unless otherwise noted.  PATIENT SURVEYS:  Neck Disability Index score: 14/50  COGNITION: Overall cognitive status: Within functional limits for tasks assessed  SENSATION: WFL  - reports intermittent   POSTURE: rounded shoulders and forward head - lateral tilt to the L side and elevated R UT/LS noted  PALPATION: L sided UT tightness/mtrp UPA L side increased pain C7-C8; relief w/   CERVICAL ROM:   Active ROM A/PROM (deg) eval 09/13/24  Flexion 55 55-normal  Extension 61 63-normal  Right lateral flexion 36 32  Left lateral flexion 35 42  Right rotation 52 65  Left rotation 68 65   (Blank rows = not tested)  *=painful  UPPER EXTREMITY ROM:  Active ROM Right 08/30/24: 09/13/24 Left 08/30/24: 09/13/24  Shoulder flexion 154 175 153 175  Shoulder extension      Shoulder abduction 165 180 178 178  Shoulder adduction      Shoulder extension      Shoulder internal rotation 60 70 60 60  Shoulder external rotation 88 90 90 60  Elbow flexion      Elbow extension      Wrist flexion      Wrist extension      Wrist ulnar deviation      Wrist radial deviation      Wrist pronation      Wrist supination       (Blank rows = not tested)  UPPER EXTREMITY MMT:  MMT Right eval 09/13/24 Left eval 10/22  Shoulder flexion 4 4+ 4 4+   Shoulder extension 4 4+ 4- 4+  Shoulder abduction 4 4+ 4 4+  Shoulder adduction      Shoulder extension      Shoulder internal rotation      Shoulder external rotation  4  4  Middle trapezius      Lower trapezius      Elbow flexion 4+  4   Elbow extension 4  3+   Wrist flexion 4 +  4-   Wrist extension 4 +  4-   Wrist ulnar deviation      Wrist radial deviation      Wrist pronation      Wrist supination      Grip strength       (Blank rows = not tested)  CERVICAL SPECIAL TESTS:  Spurling's test: Negative  and Distraction test: Positive  FUNCTIONAL TESTS:    TREATMENT DATE:  09/27/2024  Manual Therapy: -CPA of Cervical Spinal segments C2-C6, grade II-III mobilizations -STM of suboccipital and upper trap musculature Therapeutic Exercise: -Cervical snags, rotation and extension, 1 set of 10 reps bilaterally, pt cued for controlled smooth motion -Supine cervical retractions, 1 set of 10 reps, 3 second holds -UT and Sternocleidomastoid Stretch, 1 rep 20 second hold both sides, pt ops for no OP  -RTB horizontal adductions and diagonals, 1 set of 8 each direction, pt cued for eccentric control   09/13/24:  reassessed see above Sitting : Cervical and thoracic excursions x 5 each Shoulder circles x 10 W back with 2 # x 10  X to V x 10  Cervical retraction x 10  Theraband: red Scapular retraction x10 Rows  x 10 Shoulder extension x 10  Manual to cervical and upper trap area to decrease pain.   PATIENT EDUCATION:  Education details: PT evaluation, objective findings, POC, Importance of HEP, Precautions, Clinic policies Person educated: Patient Education method: Explanation and Demonstration Education comprehension: verbalized understanding and returned demonstration  HOME EXERCISE PROGRAM: Access Code: [email or web address]   Exercises - Supine Cervical Rotation AROM on Pillow  - 2 x daily - 7 x weekly - 3 sets - 10 reps - 5 hold - Supine Chin Tuck  - 2 x daily - 7 x weekly - 3  sets - 10 reps - 5 hold - Seated Assisted Cervical Rotation with Towel  - 2 x daily - 7 x weekly - 3 sets - 10 reps - 5 hold - Seated Upper Trapezius Stretch  - 2 x daily - 7 x weekly - 3 sets - 10 reps - 30 hold  ASSESSMENT:  CLINICAL IMPRESSION:   Patient continues to demonstrate decreased cervical spine ROM, increased pain with right rotation and increased tension in R upper trapezius. Patient also demonstrates good response to manual traction during today's session. Patient able to progress dynamic balance and postural activation exercises today with horizontal abductions and cervical snags, good performance with verbal cueing. Patient would continue to benefit from skilled physical therapy for decreased neck pain, increased endurance with postural musculature, increased scapular strength, and improved cervical mobility for improved quality of life, improved independence with pain management and continued progress towards therapy goals.     OBJECTIVE IMPAIRMENTS: decreased activity tolerance, decreased ROM, increased fascial restrictions, impaired flexibility, and pain.   ACTIVITY LIMITATIONS: carrying, lifting, sleeping, and caring for others  PARTICIPATION LIMITATIONS: meal prep, cleaning, laundry, and occupation  PERSONAL FACTORS: N/A are also affecting patient's functional outcome.   REHAB POTENTIAL: Good  CLINICAL DECISION MAKING: Stable/uncomplicated  EVALUATION COMPLEXITY: Low   GOALS: Goals reviewed with patient? No  SHORT TERM GOALS: Target date: 09/11/24 Patient will be independent with performance of HEP to demonstrate adequate self management of symptoms.  Baseline:  Goal status: on-going   2.   Patient will report at least a 25% improvement with function or pain overall since beginning PT. Baseline:  Goal status: on-going     LONG TERM GOALS: Target date: 10/12/24  Patient will improve cervical rotation to at least 70 degrees bilaterally in order to be safe  performing functional tasks.  Baseline:  Goal status: on-going    2.  Patient will improve tricep strength to at least 4/5 in order to indicate improved neural mobility and noted strength related to functional ADL performance.  Baseline:  Goal status:on-going    3.  Patient will report daily pain rating of 4/10 or less during ADLs to demonstrate improved activity tolerance.  Baseline:  Goal status: on-going    4. Patient will report at least a 50% improvement with function or pain overall since beginning PT. Baseline:  Goal status: on-going     PLAN:  PT FREQUENCY: 1-2x/week  PT DURATION: 8 weeks  PLANNED INTERVENTIONS: 97164- PT Re-evaluation, 97110-Therapeutic exercises, 97530- Therapeutic activity, [id number]- Neuromuscular re-education, 97535- Self Care, 02859- Manual therapy, [id number]- Electrical stimulation (manual), [id number]- Traction (mechanical), 20560 (1-2 muscles), 20561 (3+ muscles)- Dry Needling, Patient/Family education, Taping, Joint mobilization, Spinal mobilization, Cryotherapy, and Moist heat  PLAN FOR NEXT SESSION: Add cervical isometrics and wall push up.   Give pt theraband for home use.  Progress cervical mobility activities/stretching. Manual as needed.   Lauranne Beyersdorf, PT, DPT [facility] Office: [phone] 9:50 AM, 09/27/24

## 2024-10-04 ENCOUNTER — Telehealth (HOSPITAL_COMMUNITY): Payer: Self-pay

## 2024-10-04 ENCOUNTER — Encounter (HOSPITAL_COMMUNITY)

## 2024-10-04 NOTE — Telephone Encounter (Signed)
 Pt states she meant to call and reschedule this mornings appointment. Pt states she will be at therapy next week as her neck is still sore from last week. This is no show #1.  Maybell Misenheimer, PT, DPT [facility] Office: [phone] 9:30 AM, 10/04/24

## 2024-10-11 ENCOUNTER — Encounter (HOSPITAL_COMMUNITY)

## 2024-10-18 ENCOUNTER — Encounter (HOSPITAL_COMMUNITY)

## 2024-10-25 ENCOUNTER — Ambulatory Visit (HOSPITAL_COMMUNITY): Attending: Neurology

## 2024-10-25 ENCOUNTER — Encounter (HOSPITAL_COMMUNITY): Payer: Self-pay

## 2024-10-25 DIAGNOSIS — Z7409 Other reduced mobility: Secondary | ICD-10-CM | POA: Insufficient documentation

## 2024-10-25 DIAGNOSIS — M5382 Other specified dorsopathies, cervical region: Secondary | ICD-10-CM | POA: Insufficient documentation

## 2024-10-25 NOTE — Therapy (Signed)
 OUTPATIENT PHYSICAL THERAPY CERVICAL TREATMENT  PHYSICAL THERAPY DISCHARGE SUMMARY  Visits from Start of Care: 5  Current functional level related to goals / functional outcomes: Met all but one, residual weakness of LUE   Remaining deficits: residual weakness of LUE   Education / Equipment: Role of PT, POC, referral process   Patient agrees to discharge. Patient goals were partially met. Patient is being discharged due to being pleased with the current functional level.  Patient Name: Ashley Rojas MRN: [id number] DOB:[birth year], 58 y.o., female Today's Date: 10/25/2024  END OF SESSION:  PT End of Session - 10/25/24 0858     Visit Number 6    Number of Visits 8    Date for Recertification  10/18/24    Authorization Type [employer] NEXT    Authorization Time Period No auth until after visit 27 PT/OT/Speech combined    Progress Note Due on Visit 10    PT Start Time 0858    PT Stop Time 0932    PT Time Calculation (min) 34 min    Activity Tolerance Patient tolerated treatment well    Behavior During Therapy [facility] for tasks assessed/performed             Past Medical History:  Diagnosis Date   History of ileus 2004   post-op    Hypertension    Kidney stone    stones   Past Surgical History:  Procedure Laterality Date   ABDOMINAL HYSTERECTOMY     COLON SURGERY     FRACTURE SURGERY     R femur   Patient Active Problem List   Diagnosis Date Noted   Acute respiratory failure with hypoxia (HCC) 02/03/2024   Influenza A 02/03/2024   Essential hypertension 02/03/2024   Tobacco abuse 02/03/2024    PCP: Fanta, Tesfaye Desimmie, MD  REFERRING PROVIDER: Skeet Juliene SAUNDERS, DO  REFERRING DIAG: M54.2 (ICD-10-CM) - Cervicalgia  THERAPY DIAG:  Decreased ROM of intervertebral discs of cervical spine  Impaired functional mobility and activity tolerance  Rationale for Evaluation and Treatment: Rehabilitation  ONSET DATE: 1 yr or so ago  SUBJECTIVE:                                                                                                                                                                                                          SUBJECTIVE STATEMENT: Pt states she was sore for 2 weeks after last session. Pt states she was still doing the exercises and using muscle cream on neck for relief and it helped. Pt states her neck  has been feeling better for the last week as she got a new mattress and pillow. Pt states neck pain is a 0/10 this morning. Pt states she is ready for discharge and HEP independence.    PERTINENT HISTORY:  N/A  PAIN:  Are you having pain? Yes: NPRS scale: 5/10  Pain location: Neck - L side more than R side Pain description: Sharp pain with rotation to the L and sometimes to the R Aggravating factors: Turning her head, pushing/pulling, and carrying Relieving factors: Rest, Pressure at neck  PRECAUTIONS: None  RED FLAGS: None     WEIGHT BEARING RESTRICTIONS: No  FALLS:  Has patient fallen in last 6 months? No  OCCUPATION: Caregiver, Full time - doesn't drive  PLOF: Independent  PATIENT GOALS: Manage pain in the neck   NEXT MD VISIT: A little over a year now  OBJECTIVE:  Note: Objective measures were completed at Evaluation unless otherwise noted.  PATIENT SURVEYS:  Neck Disability Index score: 14/50  COGNITION: Overall cognitive status: Within functional limits for tasks assessed  SENSATION: WFL  - reports intermittent   POSTURE: rounded shoulders and forward head - lateral tilt to the L side and elevated R UT/LS noted  PALPATION: L sided UT tightness/mtrp UPA L side increased pain C7-C8; relief w/   CERVICAL ROM:   Active ROM A/PROM (deg) eval 09/13/24 10/25/24  Flexion 55 55-normal 55  Extension 61 63-normal 62  Right lateral flexion 36 32 40  Left lateral flexion 35 42 48  Right rotation 52 65 73  Left rotation 68 65 74   (Blank rows = not tested)  *=painful  UPPER  EXTREMITY ROM:  Active ROM Right 08/30/24: 09/13/24 Left 08/30/24: 09/13/24  Shoulder flexion 154 175 153 175  Shoulder extension      Shoulder abduction 165 180 178 178  Shoulder adduction      Shoulder extension      Shoulder internal rotation 60 70 60 60  Shoulder external rotation 88 90 90 60  Elbow flexion      Elbow extension      Wrist flexion      Wrist extension      Wrist ulnar deviation      Wrist radial deviation      Wrist pronation      Wrist supination       (Blank rows = not tested)  UPPER EXTREMITY MMT:  MMT Right eval 09/13/24 Left eval 10/22 10/25/24 10/25/24  Shoulder flexion 4 4+ 4 4+    Shoulder extension 4 4+ 4- 4+    Shoulder abduction 4 4+ 4 4+    Shoulder adduction        Shoulder extension        Shoulder internal rotation        Shoulder external rotation  4  4    Middle trapezius        Lower trapezius        Elbow flexion 4+  4     Elbow extension 4  3+  4+ 3+  Wrist flexion 4 +  4-     Wrist extension 4 +  4-     Wrist ulnar deviation        Wrist radial deviation        Wrist pronation        Wrist supination        Grip strength         (Blank rows = not tested)  CERVICAL SPECIAL TESTS:  Spurling's test: Negative  and Distraction test: Positive  FUNCTIONAL TESTS:    TREATMENT DATE:  10/25/2024  Progress Note: HEP and Goals reviewed, ROM and strength tested, education on importance of sleeping posture and continued HEP compliance Therapeutic Exercise: -Shoulder rows, 1 set of 10 reps, pt cued for scapular retraction -Shoulder extensions, 1 set of 10 reps, pt cued for eccentric control -Tricep extensions, 1 set of 10 reps, pt cued for keeping elbows close to side -Cervical AROM 10 reps each direction   PATIENT EDUCATION:  Education details: PT evaluation, objective findings, POC, Importance of HEP, Precautions, Clinic policies Person educated: Patient Education method: Explanation and Demonstration Education  comprehension: verbalized understanding and returned demonstration  HOME EXERCISE PROGRAM: Access Code: [email or web address] URL: [email or web address] Date: 10/25/2024 Prepared by: Lang Ada  Exercises - Supine Cervical Rotation AROM on Pillow  - 2 x daily - 7 x weekly - 3 sets - 10 reps - 5 hold - Supine Chin Tuck  - 2 x daily - 7 x weekly - 3 sets - 10 reps - 5 hold - Seated Assisted Cervical Rotation with Towel  - 2 x daily - 7 x weekly - 3 sets - 10 reps - 5 hold - Seated Upper Trapezius Stretch  - 2 x daily - 7 x weekly - 3 sets - 10 reps - 30 hold - Seated Cervical Sidebending Stretch  - 2 x daily - 7 x weekly - 1 sets - 3 reps - 30 hold - Standing Shoulder Diagonal Horizontal Abduction 60/120 Degrees with Resistance  - 1 x daily - 7 x weekly - 3 sets - 10 reps - Standing Shoulder Horizontal Abduction with Resistance  - 1 x daily - 7 x weekly - 3 sets - 10 reps - Standing Shoulder Row with Anchored Resistance  - 1 x daily - 7 x weekly - 3 sets - 10 reps - Shoulder extension with resistance - Neutral  - 1 x daily - 7 x weekly - 3 sets - 10 reps - Standing Tricep Extensions with Resistance  - 1 x daily - 7 x weekly - 3 sets - 10 reps - Elbow Extension with Anchored Resistance  - 1 x daily - 7 x weekly - 3 sets - 10 reps  ASSESSMENT:  CLINICAL IMPRESSION:   Patient demonstrates significant improvement in cervical mobility and decreased pain with 0/10 pain rating and increased ROM obtained during measurements today. Pt has been sleeping better and waking up with no pain due to new mattress and pillow as pt was sleeping with 2 pillows and causing excess strain on the neck. Pt educated on correct sleeping posture and HEP revisions and referral process should pt be in need of therapy services in the future. Patient also continues to demonstrate residual weakness in LUE elbow extension, HEP revisions to address. Patient has met all but one strength goals and is happy with current level of  function, pt to be discharged to HEP at this time.     OBJECTIVE IMPAIRMENTS: decreased activity tolerance, decreased ROM, increased fascial restrictions, impaired flexibility, and pain.   ACTIVITY LIMITATIONS: carrying, lifting, sleeping, and caring for others  PARTICIPATION LIMITATIONS: meal prep, cleaning, laundry, and occupation  PERSONAL FACTORS: N/A are also affecting patient's functional outcome.   REHAB POTENTIAL: Good  CLINICAL DECISION MAKING: Stable/uncomplicated  EVALUATION COMPLEXITY: Low   GOALS: Goals reviewed with patient? No  SHORT TERM GOALS: Target date: 09/11/24 Patient will be independent with performance of HEP  to demonstrate adequate self management of symptoms.  Baseline:  Goal status: MET   2.   Patient will report at least a 25% improvement with function or pain overall since beginning PT. Baseline:  Goal status: MET     LONG TERM GOALS: Target date: 10/12/24  Patient will improve cervical rotation to at least 70 degrees bilaterally in order to be safe performing functional tasks.  Baseline:  Goal status: MET    2.  Patient will improve tricep strength to at least 4/5 in order to indicate improved neural mobility and noted strength related to functional ADL performance.  Baseline:  Goal status: Not Met    3.  Patient will report daily pain rating of 4/10 or less during ADLs to demonstrate improved activity tolerance.  Baseline:  Goal status: MET    4. Patient will report at least a 50% improvement with function or pain overall since beginning PT. Baseline:  Goal status: MET     PLAN:  PT FREQUENCY: 1-2x/week  PT DURATION: 8 weeks  PLANNED INTERVENTIONS: 97164- PT Re-evaluation, 97110-Therapeutic exercises, 97530- Therapeutic activity, 97112- Neuromuscular re-education, 97535- Self Care, 02859- Manual therapy, [id number]- Electrical stimulation (manual), [id number]- Traction (mechanical), 20560 (1-2 muscles), 20561 (3+ muscles)- Dry  Needling, Patient/Family education, Taping, Joint mobilization, Spinal mobilization, Cryotherapy, and Moist heat  PLAN FOR NEXT SESSION: Discharged   Lang Ada, PT, DPT [facility] Office: [phone] 9:43 AM, 10/25/24

## 2024-11-06 NOTE — Progress Notes (Unsigned)
 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  If she has not received a call to schedule in one week, to contact use Follow up 6 months    Subjective:  [name] is a 58 year old female with hypertension, cigarette smoker and history of MRI-negative right thalamic stroke with chronic thalamic pain syndrome follows up for headache and thalamic pain syndrome.  UPDATE: Thalamic Pain Syndrome: Taking duloxetine  90mg  daily and gabapentin  600mg  every 4 hours (she takes it 1200mg  in morning and 1200mg  at night). Pain is manageable.   Left sided Neck pain/radiculopathy: Prescribed baclofen  and referred to PT for neck pain.  ***  Still has a burning on occasion.  Arm may still jump a bith.  Current medications: Current NSAIDS/analgesics:  ASA 325mg  daily Current triptans:  CONTRAINDICATED Current ergotamine:  none Current anti-emetic:  none Current muscle relaxants:  baclofen  10mg  TID PRN Current Antihypertensive medications:  atenolol  50mg  BID, amlodipine  10mg  QD Current Antidepressant medications:  duloxetine  90mg  daily (chronic pain) Current Anticonvulsant medications:  gabapentin  600mg  QID Current anti-CGRP:  none Current Vitamins/Herbal/Supplements:  none Current Antihistamines/Decongestants:  none  HISTORY: Right Thalamic Pain Syndrome: She had a presumed DWI-negative right thalamic stroke in June 2020.  She presented to the ED with acute onset of left sided upper and lower extremity numbness.  No weakness, speech disturbance, facial droop.  MRI of brain was negative for acute intracranial abnormality or significant white matter  disease.  MRA of head showed no LVO, significant stenosis or aneurysm.  Within a couple of days she developed burning pain involving the left leg but not in a dermatomal distribution.  Sometimes she experiences painful spasms on the left side of her torso.  No pain in the arm but sometimes it jumps. More recently, she has been experiencing left sided neck pain.  If she turns her head to the left, she feels a shooting pain up the neck and back of the head.  Sometimes her left arm will jerk.   Headaches: Longstanding history of headaches that became worse after stroke.  Either side, sharp.  Sometimes nausea, no photophobia or phonophobia.  Typically lasts 45 minutes with Tylenol .  They occur twice a week.    Past NSAIDS/analgesics:  hydrocodone  Past abortive triptans:  CONTRAINDICATED Past abortive ergotamine:  none Past muscle relaxants:  Robaxin  Past anti-emetic:  none Past antihypertensive medications:  none Past antidepressant medications:  none Past anticonvulsant medications:  none Past anti-CGRP:  none Past vitamins/Herbal/Supplements:  none Past antihistamines/decongestants:  none Other past therapies:  none  PAST MEDICAL HISTORY: Past Medical History:  Diagnosis Date   History of ileus 2004   post-op    Hypertension    Kidney stone    stones    MEDICATIONS: Medications Ordered Prior to Encounter[1]  ALLERGIES: Allergies[2]  FAMILY HISTORY: No family history on file.    Objective:  *** General: No acute distress.  Patient appears ***-groomed.   Head:  Normocephalic/atraumatic Eyes:  Fundi examined but not visualized Neck: supple, no paraspinal tenderness, full range of motion Heart:  Regular rate and rhythm Neurological Exam: alert and oriented.  Speech fluent and not dysarthric, language intact.  CN II-XII intact. Bulk and  tone normal, muscle strength 5/5 throughout.  Sensation to light touch intact.  Deep tendon reflexes 2+ throughout, toes downgoing.  Finger  to nose testing intact.  Gait normal, Romberg negative.   Juliene Dunnings, DO  CC: ***          [1]  Current Outpatient Medications on File Prior to Visit  Medication Sig Dispense Refill   albuterol  (VENTOLIN  HFA) 108 (90 Base) MCG/ACT inhaler Inhale 2 puffs into the lungs every 6 (six) hours as needed for wheezing or shortness of breath.     albuterol  (VENTOLIN  HFA) 108 (90 Base) MCG/ACT inhaler Inhale 2 puffs into the lungs every 6 (six) hours as needed for wheezing or shortness of breath. 8 g 2   amLODipine  (NORVASC ) 10 MG tablet Take 10 mg by mouth daily.     aspirin  EC 325 MG tablet Take 325 mg by mouth daily.     atenolol  (TENORMIN ) 50 MG tablet Take 50 mg by mouth daily.     baclofen  (LIORESAL ) 10 MG tablet Take 1 tablet (10 mg total) by mouth 3 (three) times daily as needed for muscle spasms. 90 each 5   benzonatate  (TESSALON  PERLES) 100 MG capsule Take 1 capsule (100 mg total) by mouth 3 (three) times daily as needed for cough. (Patient not taking: Reported on 04/18/2024) 30 capsule 0   buPROPion (WELLBUTRIN SR) 150 MG 12 hr tablet Take 150 mg by mouth 2 (two) times daily. (Patient not taking: Reported on 04/18/2024)     cholecalciferol (VITAMIN D3) 25 MCG (1000 UNIT) tablet Take 1,000 Units by mouth daily.     dicyclomine  (BENTYL ) 20 MG tablet Take 1 tablet (20 mg total) by mouth 2 (two) times daily. 20 tablet 0   DULoxetine  (CYMBALTA ) 30 MG capsule Take 1 capsule (30 mg total) by mouth daily. Take with 60mg  capsule. 30 capsule 5   DULoxetine  (CYMBALTA ) 60 MG capsule Take 1 capsule (60 mg total) by mouth daily. Take with 30mg  capsule. 30 capsule 5   gabapentin  (NEURONTIN ) 600 MG tablet Take 2 tablets (1,200 mg total) by mouth in the morning and at bedtime. 120 tablet 5   guaiFENesin -dextromethorphan  (ROBITUSSIN DM) 100-10 MG/5ML syrup Take 5 mLs by mouth every 4 (four) hours as needed for cough. (Patient not taking: Reported on 04/18/2024) 118 mL 0   methylPREDNISolone  (MEDROL   DOSEPAK) 4 MG TBPK tablet Medrol  Dosepak take as instructed (Patient not taking: Reported on 04/18/2024) 21 tablet 0   nicotine  (NICODERM CQ  - DOSED IN MG/24 HOURS) 21 mg/24hr patch Place 1 patch (21 mg total) onto the skin daily. (Patient not taking: Reported on 04/18/2024) 28 patch 0   pantoprazole  (PROTONIX ) 40 MG tablet Take 1 tablet (40 mg total) by mouth daily for 10 days. 10 tablet 0   predniSONE  (DELTASONE ) 10 MG tablet Take 2 tablets (20 mg total) by mouth 2 (two) times daily. 20 tablet 0   No current facility-administered medications on file prior to visit.  [2] No Known Allergies

## 2024-11-07 ENCOUNTER — Encounter: Payer: Self-pay | Admitting: Neurology

## 2024-11-07 ENCOUNTER — Ambulatory Visit: Admitting: Neurology

## 2024-11-07 ENCOUNTER — Other Ambulatory Visit (HOSPITAL_COMMUNITY)
Admission: RE | Admit: 2024-11-07 | Discharge: 2024-11-07 | Disposition: A | Discharge status: Home / Self Care | Source: Ambulatory Visit | Attending: Internal Medicine | Admitting: Internal Medicine

## 2024-11-07 ENCOUNTER — Telehealth: Payer: Self-pay

## 2024-11-07 VITALS — BP 118/72 | HR 74 | Ht 64.0 in | Wt 149.6 lb

## 2024-11-07 DIAGNOSIS — M5481 Occipital neuralgia: Secondary | ICD-10-CM | POA: Diagnosis not present

## 2024-11-07 DIAGNOSIS — Z0001 Encounter for general adult medical examination with abnormal findings: Secondary | ICD-10-CM | POA: Diagnosis present

## 2024-11-07 DIAGNOSIS — M542 Cervicalgia: Secondary | ICD-10-CM

## 2024-11-07 DIAGNOSIS — G890 Central pain syndrome: Secondary | ICD-10-CM | POA: Diagnosis not present

## 2024-11-07 DIAGNOSIS — Z1329 Encounter for screening for other suspected endocrine disorder: Secondary | ICD-10-CM | POA: Insufficient documentation

## 2024-11-07 LAB — HEPATIC FUNCTION PANEL
ALT: 9 U/L (ref 0–44)
AST: 16 U/L (ref 15–41)
Albumin: 4.3 g/dL (ref 3.5–5.0)
Alkaline Phosphatase: 96 U/L (ref 38–126)
Bilirubin, Direct: <0.1 mg/dL (ref 0.0–0.2)
Total Bilirubin: <0.2 mg/dL (ref 0.0–1.2)
Total Protein: 7.2 g/dL (ref 6.5–8.1)

## 2024-11-07 LAB — T4, FREE: Free T4: 1.03 ng/dL (ref 0.80–2.00)

## 2024-11-07 LAB — BASIC METABOLIC PANEL WITH GFR
Anion gap: 9 (ref 5–15)
BUN: <5 mg/dL — ABNORMAL LOW (ref 6–20)
CO2: 30 mmol/L (ref 22–32)
Calcium: 9 mg/dL (ref 8.9–10.3)
Chloride: 102 mmol/L (ref 98–111)
Creatinine, Ser: 0.56 mg/dL (ref 0.44–1.00)
GFR, Estimated: >60 mL/min (ref >60)
Glucose, Bld: 113 mg/dL — ABNORMAL HIGH (ref 70–99)
Potassium: 4.2 mmol/L (ref 3.5–5.1)
Sodium: 141 mmol/L (ref 135–145)

## 2024-11-07 LAB — CBC WITH DIFFERENTIAL/PLATELET
Abs Immature Granulocytes: 0.03 K/uL (ref 0.00–0.07)
Basophils Absolute: 0 K/uL (ref 0.0–0.1)
Basophils Relative: 0 %
Eosinophils Absolute: 0.1 K/uL (ref 0.0–0.5)
Eosinophils Relative: 1 %
HCT: 37.1 % (ref 36.0–46.0)
Hemoglobin: 11.3 g/dL — ABNORMAL LOW (ref 12.0–15.0)
Immature Granulocytes: 0 %
Lymphocytes Relative: 32 %
Lymphs Abs: 2.4 K/uL (ref 0.7–4.0)
MCH: 24.5 pg — ABNORMAL LOW (ref 26.0–34.0)
MCHC: 30.5 g/dL (ref 30.0–36.0)
MCV: 80.5 fL (ref 80.0–100.0)
Monocytes Absolute: 0.7 K/uL (ref 0.1–1.0)
Monocytes Relative: 9 %
Neutro Abs: 4.4 K/uL (ref 1.7–7.7)
Neutrophils Relative %: 58 %
Platelets: 295 K/uL (ref 150–400)
RBC: 4.61 MIL/uL (ref 3.87–5.11)
RDW: 20 % — ABNORMAL HIGH (ref 11.5–15.5)
WBC: 7.7 K/uL (ref 4.0–10.5)
nRBC: 0 % (ref 0.0–0.2)

## 2024-11-07 LAB — LIPID PANEL
Cholesterol: 201 mg/dL — ABNORMAL HIGH (ref 0–200)
HDL: 40 mg/dL — ABNORMAL LOW (ref >40)
LDL Cholesterol: 123 mg/dL — ABNORMAL HIGH (ref 0–99)
Total CHOL/HDL Ratio: 5 ratio
Triglycerides: 190 mg/dL — ABNORMAL HIGH (ref <150)
VLDL: 38 mg/dL (ref 0–40)

## 2024-11-07 LAB — TSH: TSH: 1.05 u[IU]/mL (ref 0.350–4.500)

## 2024-11-07 NOTE — Patient Instructions (Addendum)
 Check MRI of cervical spine without contrast. We have sent a referral to [facility] for your MRI and they will call you directly to schedule your appointment. They are located at [address] [facility]. If you need to contact them directly please call [phone].  Continue duloxetine  30mg  and 90mg  daily Continue gabapentin  1200mg  twice daily Continue neck exercises at home Follow up 6 months.

## 2024-11-07 NOTE — Telephone Encounter (Signed)
 Member Name: Ashley Rojas Gender: Female Date of Birth: March 05, 1966 Member ID: [id number] Health Plan: [id number] [facility] [employer] Silver - EX - HMO - FI Spoken Language: ENGLISH Written Language: ENGLISH Referring Physician Name: Juliene Dunnings Address: [address] [place], [place] [address] Phone: [phone] Tax ID: [id number] UPIN:  Specialty: Unknown Rendering Provider Name: [facility] Address: [address] [place], [place] [address] Phone: [phone] Tax ID: [id number]   Case Case Description: Cervical Spine MRI Request ID: Tracking: Not Available [id number] Request Date: 2024-11-25 09:56 AM Status: In Review Entry Method: RadMD Validity Dates: [Not Applicable] ICD10: M54.2, M54.81  Contact Name: jesusa sharper (Referring Provider) Retro Flag Description: Prospective Review Expedited Appeal: No Appeal Extension: No Initial Determination Date: NOT COMPLETED Email: [email or web address] Final Determination Date: NOT COMPLETED    Please be advised that all data was current as of Tuesday, Nov 25, 2024 at 11:59 AM MST Radiology Date of Service: 11-25-2024   Update Expedited: No Extension: No CPT4: 72141 Billable Codes Clinical Rcvd: 11/25/24 - Clinical information received via fax or upload     Medical Necessity Evaluation Question Answer What is the reason for this request? The reason for this request is for another concern not listed. Status History Date Status 11-25-24 11:57AM Your clinical documentation has been received and is being reviewed by our clinical staff. You will be notifed of the determination. 2024-11-25 11:53AM This request is being reviewed by our physicians 2024/11/25 11:53AM Your clinical documentation has been received and is being reviewed by our clinical staff. You will be notifed of the determination. Nov 25, 2024 09:56AM Please fax or upload  the clinical documentation requested in the fax or email we sent to you. Documents Received Date Received Name Size Method Actions 11-25-24 11:53 AM A.T .pdf 133.3KB Upload RadMD User [phone] View 11/25/2024 11:57 AM A.T Physical.pdf 107KB Upload Rad[facility] User [phone] View Documents Sent Date Sent Pages Recipient Actions 11-25-2024 10:31 AM 3 Referring Physician View

## 2024-11-10 NOTE — Telephone Encounter (Signed)
 Your doctors request for a(n) Cervical Spine MRI (Magnetic Resonance Imaging - pictures of inside your upper spine) has been denied.   [employer] Clinical Guideline 040 for Cervical Spine MRI was used to make this decision.  This decision was based on the notes that were sent: you have neck pain.  Before we can approve, we need the following notes: notes from your doctor that show you completed a neck exercise plan (physical therapy, a medically directed home exercise program, or chiropractic treatment) for at least six weeks or more and did not get better. These notes should say that you did those exercises in the last six months. We need details of the dates and how often you did them. Your doctor could also send your therapy notes. If you did exercises at home, we also need to know what exercises you did. These were not given to us .  It is suggested that you follow up with your doctor for the next step in your care.

## 2024-11-15 NOTE — Telephone Encounter (Signed)
 PEER-PEER [phone].   Uploaded more PT notes.

## 2024-11-17 ENCOUNTER — Ambulatory Visit (HOSPITAL_COMMUNITY)

## 2024-11-20 NOTE — Telephone Encounter (Signed)
 Apporved 11/07/24-12/07/24  Auth# [id number]

## 2025-05-28 ENCOUNTER — Ambulatory Visit: Payer: Self-pay | Admitting: Neurology
